# Patient Record
Sex: Female | Born: 1980 | Race: Black or African American | Hispanic: No | Marital: Married | Smoking: Never smoker
Health system: Southern US, Community
[De-identification: ages and names within clinical notes are randomized; demographics above are authoritative.]

## PROBLEM LIST (undated history)

## (undated) DIAGNOSIS — C801 Malignant (primary) neoplasm, unspecified: Secondary | ICD-10-CM

## (undated) DIAGNOSIS — D649 Anemia, unspecified: Secondary | ICD-10-CM

## (undated) DIAGNOSIS — I10 Essential (primary) hypertension: Secondary | ICD-10-CM

## (undated) DIAGNOSIS — F419 Anxiety disorder, unspecified: Secondary | ICD-10-CM

## (undated) HISTORY — PX: FACIAL COSMETIC SURGERY: SHX629

## (undated) HISTORY — DX: Essential (primary) hypertension: I10

---

## 2011-07-28 DIAGNOSIS — D649 Anemia, unspecified: Secondary | ICD-10-CM | POA: Insufficient documentation

## 2015-07-20 ENCOUNTER — Emergency Department
Admission: EM | Admit: 2015-07-20 | Discharge: 2015-07-20 | Disposition: A | Discharge status: Home / Self Care | Payer: Self-pay | Attending: Student | Admitting: Student

## 2015-07-20 ENCOUNTER — Encounter: Payer: Self-pay | Admitting: Emergency Medicine

## 2015-07-20 DIAGNOSIS — Y9289 Other specified places as the place of occurrence of the external cause: Secondary | ICD-10-CM | POA: Insufficient documentation

## 2015-07-20 DIAGNOSIS — Y998 Other external cause status: Secondary | ICD-10-CM | POA: Insufficient documentation

## 2015-07-20 DIAGNOSIS — E669 Obesity, unspecified: Secondary | ICD-10-CM | POA: Insufficient documentation

## 2015-07-20 DIAGNOSIS — R030 Elevated blood-pressure reading, without diagnosis of hypertension: Secondary | ICD-10-CM | POA: Insufficient documentation

## 2015-07-20 DIAGNOSIS — Y9389 Activity, other specified: Secondary | ICD-10-CM | POA: Insufficient documentation

## 2015-07-20 DIAGNOSIS — W57XXXA Bitten or stung by nonvenomous insect and other nonvenomous arthropods, initial encounter: Secondary | ICD-10-CM | POA: Insufficient documentation

## 2015-07-20 DIAGNOSIS — L509 Urticaria, unspecified: Secondary | ICD-10-CM | POA: Insufficient documentation

## 2015-07-20 HISTORY — DX: Anemia, unspecified: D64.9

## 2015-07-20 MED ORDER — HYDROXYZINE HCL 50 MG PO TABS
50.0000 mg | ORAL_TABLET | Freq: Once | ORAL | Status: AC
  Administered 2015-07-20: 50 mg via ORAL
  Filled 2015-07-20: qty 1

## 2015-07-20 MED ORDER — METHYLPREDNISOLONE 4 MG PO TBPK: ORAL_TABLET | ORAL | Status: DC

## 2015-07-20 MED ORDER — HYDROXYZINE HCL 50 MG PO TABS: 50.0000 mg | ORAL_TABLET | Freq: Three times a day (TID) | ORAL | Status: DC | PRN

## 2015-07-20 MED ORDER — PREDNISONE 20 MG PO TABS
60.0000 mg | ORAL_TABLET | Freq: Once | ORAL | Status: AC
  Administered 2015-07-20: 60 mg via ORAL
  Filled 2015-07-20: qty 3

## 2015-07-20 NOTE — ED Provider Notes (Signed)
[facility] Emergency Department Provider Note  ____________________________________________  Time seen: Approximately 6:21 PM  I have reviewed the triage vital signs and the nursing notes.   HISTORY  Chief Complaint Insect Bite    HPI Amanda Pearson is a 34 y.o. female patient reported hives secondary to insect bites on bilateral hands and legs and facial area. Patient state is very intense itching. Patient also states she's been in contact file a dog that was when was eyes, from the exposure to the animal. Patient denies any dysuria fever or chills. Patient denies any nausea vomiting diarrhea. She rates her discomfort as a 5/10. Patient is taking over-the-counter Benadryl with no relief.   Past Medical History  Diagnosis Date  . Anemia     There are no active problems to display for this patient.   Past Surgical History  Procedure Laterality Date  . Facial cosmetic surgery      Current Outpatient Rx  Name  Route  Sig  Dispense  Refill  . hydrOXYzine (ATARAX/VISTARIL) 50 MG tablet   Oral   Take 1 tablet (50 mg total) by mouth 3 (three) times daily as needed.   30 tablet   0   . methylPREDNISolone (MEDROL DOSEPAK) 4 MG TBPK tablet      Take Tapered dose as directed   21 tablet   0     Allergies Review of patient's allergies indicates no known allergies.  No family history on file.  Social History History  Substance Use Topics  . Smoking status: Never Smoker   . Smokeless tobacco: Not on file  . Alcohol Use: No    Review of Systems Constitutional: No fever/chills Eyes: No visual changes. ENT: No sore throat. Cardiovascular: Denies chest pain. Respiratory: Denies shortness of breath. Gastrointestinal: No abdominal pain.  No nausea, no vomiting.  No diarrhea.  No constipation. Genitourinary: Negative for dysuria. Musculoskeletal: Negative for back pain. Skin: Hives Neurological: Negative for headaches, focal weakness or  numbness. Endocrine: Hematological/Lymphatic: Allergic/Immunilogical:10-point ROS otherwise negative.  ____________________________________________   PHYSICAL EXAM:  VITAL SIGNS: ED Triage Vitals  Enc Vitals Group     BP 07/20/15 1738 164/90 mmHg     Pulse Rate 07/20/15 1738 97     Resp 07/20/15 1738 18     Temp 07/20/15 1738 98.1 F (36.7 C)     Temp Source 07/20/15 1738 Oral     SpO2 07/20/15 1738 100 %     Weight 07/20/15 1738 231 lb (104.781 kg)     Height 07/20/15 1738 5\' 3"  (1.6 m)     Head Cir --      Peak Flow --      Pain Score 07/20/15 1738 5     Pain Loc --      Pain Edu? --      Excl. in [place]? --     Constitutional: Alert and oriented. Well appearing and in no acute distress. Obesity Eyes: Conjunctivae are normal. PERRL. EOMI. Head: Atraumatic. Nose: No congestion/rhinnorhea. Mouth/Throat: Mucous membranes are moist.  Oropharynx non-erythematous. Neck: No stridor. No cervical spine tenderness to palpation. Hematological/Lymphatic/Immunilogical: No cervical lymphadenopathy. Cardiovascular: Normal rate, regular rhythm. Grossly normal heart sounds.  Good peripheral circulation. Mild elevation of blood pressure. Respiratory: Normal respiratory effort.  No retractions. Lungs CTAB. Gastrointestinal: Soft and nontender. No distention. No abdominal bruits. No CVA tenderness. Musculoskeletal: No lower extremity tenderness nor edema.  No joint effusions. Neurologic:  Normal speech and language. No gross focal neurologic deficits are appreciated.  No gait instability. Skin:  Skin is warm, dry and intact. Macular lesion upper and lower extremities . No signs of secondary infection.  Psychiatric: Mood and affect are normal. Speech and behavior are normal.  ____________________________________________   LABS (all labs ordered are listed, but only abnormal results are displayed)  Labs Reviewed - No data to  display ____________________________________________  EKG   ____________________________________________  RADIOLOGY   ____________________________________________   PROCEDURES  Procedure(s) performed: None  Critical Care performed: No  ____________________________________________   INITIAL IMPRESSION / ASSESSMENT AND PLAN / ED COURSE  Pertinent labs & imaging results that were available during my care of the patient were reviewed by me and considered in my medical decision making (see chart for details).  Hives etiology unknown. Patient given Atarax and prednisone in the ER. Patient described a Medrol Dosepak and Atarax on discharge. Patient advised follow-up family doctor if no improvement in 2-3 days. He is advised determine if condition worsens. ____________________________________________   FINAL CLINICAL IMPRESSION(S) / ED DIAGNOSES  Final diagnoses:  Hives of unknown origin      Sable Feil, PA-C 07/20/15 [place], MD 07/20/15 [phone]

## 2015-07-20 NOTE — ED Notes (Signed)
Pt reports hives/insect bites to bilateral arms, legs, face since Monday. Pt reports itching, was around a dog, unsure if from that.

## 2017-10-16 ENCOUNTER — Emergency Department: Payer: BC Managed Care – PPO

## 2017-10-16 ENCOUNTER — Encounter: Payer: Self-pay | Admitting: Emergency Medicine

## 2017-10-16 ENCOUNTER — Emergency Department
Admission: EM | Admit: 2017-10-16 | Discharge: 2017-10-16 | Disposition: A | Discharge status: Home / Self Care | Payer: BC Managed Care – PPO | Attending: Emergency Medicine | Admitting: Emergency Medicine

## 2017-10-16 DIAGNOSIS — D509 Iron deficiency anemia, unspecified: Secondary | ICD-10-CM | POA: Insufficient documentation

## 2017-10-16 DIAGNOSIS — Z79899 Other long term (current) drug therapy: Secondary | ICD-10-CM | POA: Diagnosis not present

## 2017-10-16 DIAGNOSIS — M79604 Pain in right leg: Secondary | ICD-10-CM

## 2017-10-16 LAB — COMPREHENSIVE METABOLIC PANEL
ALT: 14 U/L (ref 14–54)
AST: 17 U/L (ref 15–41)
Albumin: 3.8 g/dL (ref 3.5–5.0)
Alkaline Phosphatase: 46 U/L (ref 38–126)
Anion gap: 8 (ref 5–15)
BUN: 15 mg/dL (ref 6–20)
CO2: 22 mmol/L (ref 22–32)
Calcium: 9 mg/dL (ref 8.9–10.3)
Chloride: 104 mmol/L (ref 101–111)
Creatinine, Ser: 0.74 mg/dL (ref 0.44–1.00)
GFR calc Af Amer: >60 mL/min (ref >60)
GFR calc non Af Amer: >60 mL/min (ref >60)
Glucose, Bld: 85 mg/dL (ref 65–99)
Potassium: 3.7 mmol/L (ref 3.5–5.1)
Sodium: 134 mmol/L — ABNORMAL LOW (ref 135–145)
Total Bilirubin: 0.7 mg/dL (ref 0.3–1.2)
Total Protein: 8.1 g/dL (ref 6.5–8.1)

## 2017-10-16 LAB — CBC WITH DIFFERENTIAL/PLATELET
Basophils Absolute: 0 10*3/uL (ref 0–0.1)
Basophils Relative: 0 %
Eosinophils Absolute: 0 10*3/uL (ref 0–0.7)
Eosinophils Relative: 0 %
HCT: 28.8 % — ABNORMAL LOW (ref 35.0–47.0)
Hemoglobin: 8.6 g/dL — ABNORMAL LOW (ref 12.0–16.0)
Lymphocytes Relative: 16 %
Lymphs Abs: 1.6 10*3/uL (ref 1.0–3.6)
MCH: 20.4 pg — ABNORMAL LOW (ref 26.0–34.0)
MCHC: 30 g/dL — ABNORMAL LOW (ref 32.0–36.0)
MCV: 67.9 fL — ABNORMAL LOW (ref 80.0–100.0)
Monocytes Absolute: 0.6 10*3/uL (ref 0.2–0.9)
Monocytes Relative: 6 %
Neutro Abs: 7.9 10*3/uL — ABNORMAL HIGH (ref 1.4–6.5)
Neutrophils Relative %: 78 %
Platelets: 429 10*3/uL (ref 150–440)
RBC: 4.24 MIL/uL (ref 3.80–5.20)
RDW: 18.7 % — ABNORMAL HIGH (ref 11.5–14.5)
WBC: 10.1 10*3/uL (ref 3.6–11.0)

## 2017-10-16 MED ORDER — ORPHENADRINE CITRATE 30 MG/ML IJ SOLN
60.0000 mg | Freq: Two times a day (BID) | INTRAMUSCULAR | Status: DC
  Administered 2017-10-16: 60 mg via INTRAMUSCULAR
  Filled 2017-10-16: qty 2

## 2017-10-16 MED ORDER — ORPHENADRINE CITRATE ER 100 MG PO TB12: 100.0000 mg | ORAL_TABLET | Freq: Two times a day (BID) | ORAL | 0 refills | Status: AC | PRN

## 2017-10-16 NOTE — ED Triage Notes (Signed)
Pt states that she has been having pain in her right leg x several months. Pt states that the pain feels like little sharp pains. Pt denies swelling in the leg. No recent travel.

## 2017-10-16 NOTE — ED Provider Notes (Signed)
[facility] Emergency Department Provider Note  ____________________________________________  Time seen: Approximately 3:51 PM  I have reviewed the triage vital signs and the nursing notes.   HISTORY  Chief Complaint Leg Pain    HPI Amanda Pearson is a 36 y.o. female  presenting to the emergency department with 8 out of 10, intermittent right posterior leg pain characterized as sharp and constricting.  Patient reports that she has experienced pain since September of this year.  Episodes of right posterior leg pain are increasing in frequency.  Patient reports that she was recently in the car for several hours during a family vacation but her right posterior leg pain had occurred prior to travel.  She denies daily smoking, other forms of recent travel, knowledge of current malignancy or history of DVT.  Patient reports that she does not experience pain in the morning but episodes of right posterior knee pain increase in frequency in the afternoon and evening.  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  She denies a history of back pain and has never had sciatica.  She denies knee pain.  She denies falls, mechanisms of trauma or new physical activity habits.  No alleviating measures have been attempted.   Past Medical History:  Diagnosis Date  . Anemia   . Anemia     There are no active problems to display for this patient.   Past Surgical History:  Procedure Laterality Date  . FACIAL COSMETIC SURGERY      Prior to Admission medications   Medication Sig Start Date End Date Taking? Authorizing Provider  hydrOXYzine (ATARAX/VISTARIL) 50 MG tablet Take 1 tablet (50 mg total) by mouth 3 (three) times daily as needed. 07/20/15   Sable Feil, PA-C  methylPREDNISolone (MEDROL DOSEPAK) 4 MG TBPK tablet Take Tapered dose as directed 07/20/15   Sable Feil, PA-C  orphenadrine (NORFLEX) 100 MG tablet Take 1 tablet (100  mg total) 2 (two) times daily as needed for up to 7 days by mouth for muscle spasms. 10/16/17 10/23/17  Lannie Fields, PA-C    Allergies Patient has no known allergies.  No family history on file.  Social History Social History   Tobacco Use  . Smoking status: Never Smoker  . Smokeless tobacco: Never Used  Substance Use Topics  . Alcohol use: No  . Drug use: No     Review of Systems  Constitutional: No fever/chills Eyes: No visual changes. No discharge ENT: No upper respiratory complaints. Cardiovascular: no chest pain. Respiratory: no cough. No SOB. Gastrointestinal: No abdominal pain.  No nausea, no vomiting.  No diarrhea.  No constipation. Musculoskeletal: Patient has right posterior leg pain.  Skin: Negative for rash, abrasions, lacerations, ecchymosis. Neurological: Negative for headaches, focal weakness or numbness.   ____________________________________________   PHYSICAL EXAM:  VITAL SIGNS: ED Triage Vitals  Enc Vitals Group     BP 10/16/17 1446 (!) 152/96     Pulse Rate 10/16/17 1446 (!) 106     Resp 10/16/17 1446 18     Temp 10/16/17 1446 99 F (37.2 C)     Temp Source 10/16/17 1446 Oral     SpO2 10/16/17 1446 100 %     Weight 10/16/17 1446 235 lb (106.6 kg)     Height 10/16/17 1446 5\' 3"  (1.6 m)     Head Circumference --      Peak Flow --      Pain  Score 10/16/17 1448 3     Pain Loc --      Pain Edu? --      Excl. in [place]? --      Constitutional: Alert and oriented. Well appearing and in no acute distress. Eyes: Conjunctivae are normal. PERRL. EOMI. Head: Atraumatic. Cardiovascular: Normal rate, regular rhythm. Normal S1 and S2.  Good peripheral circulation. Respiratory: Normal respiratory effort without tachypnea or retractions. Lungs CTAB. Good air entry to the bases with no decreased or absent breath sounds. Gastrointestinal: Bowel sounds 4 quadrants. Soft and nontender to palpation. No guarding or rigidity. No palpable masses. No  distention. No CVA tenderness. Musculoskeletal: Patient has full range of motion at the right hip, right knee and right ankle.  Patient has no popliteal fullness or pain elicited with palpation at the insertion of the hamstrings.  No calf tenderness was elicited with palpation.  Palpable dorsalis pedis pulse, right. Neurologic:  Normal speech and language. No gross focal neurologic deficits are appreciated.  Skin: No edema or surrounding cellulitis was visualized of the right lower extremity. Psychiatric: Mood and affect are normal. Speech and behavior are normal. Patient exhibits appropriate insight and judgement.   ____________________________________________   LABS (all labs ordered are listed, but only abnormal results are displayed)  Labs Reviewed  CBC WITH DIFFERENTIAL/PLATELET - Abnormal; Notable for the following components:      Result Value   Hemoglobin 8.6 (*)    HCT 28.8 (*)    MCV 67.9 (*)    MCH 20.4 (*)    MCHC 30.0 (*)    RDW 18.7 (*)    Neutro Abs 7.9 (*)    All other components within normal limits  COMPREHENSIVE METABOLIC PANEL - Abnormal; Notable for the following components:   Sodium 134 (*)    All other components within normal limits   ____________________________________________  EKG   ____________________________________________  RADIOLOGY Unk Pinto, personally viewed and evaluated these images  as part of my medical decision making, as well as reviewing the written report by the radiologist.  US Venous Img Lower Unilateral Right  Result Date: 10/16/2017 CLINICAL DATA:  Right lower extremity pain for 1 month. EXAM: Right LOWER EXTREMITY VENOUS DOPPLER ULTRASOUND TECHNIQUE: Gray-scale sonography with graded compression, as well as color Doppler and duplex ultrasound were performed to evaluate the lower extremity deep venous systems from the level of the common femoral vein and including the common femoral, femoral, profunda femoral, popliteal and  calf veins including the posterior tibial, peroneal and gastrocnemius veins when visible. The superficial great saphenous vein was also interrogated. Spectral Doppler was utilized to evaluate flow at rest and with distal augmentation maneuvers in the common femoral, femoral and popliteal veins. COMPARISON:  None. FINDINGS: Contralateral Common Femoral Vein: Respiratory phasicity is normal and symmetric with the symptomatic side. No evidence of thrombus. Normal compressibility. Common Femoral Vein: No evidence of thrombus. Normal compressibility, respiratory phasicity and response to augmentation. Saphenofemoral Junction: No evidence of thrombus. Normal compressibility and flow on color Doppler imaging. Profunda Femoral Vein: No evidence of thrombus. Normal compressibility and flow on color Doppler imaging. Femoral Vein: No evidence of thrombus. Normal compressibility, respiratory phasicity and response to augmentation. Popliteal Vein: No evidence of thrombus. Normal compressibility, respiratory phasicity and response to augmentation. Calf Veins: No evidence of thrombus. Normal compressibility and flow on color Doppler imaging. Superficial Great Saphenous Vein: No evidence of thrombus. Normal compressibility. Venous Reflux:  None. Other Findings:  None. IMPRESSION: No evidence of deep  venous thrombosis. Electronically Signed   By: San Morelle M.D.   On: 10/16/2017 17:20    ____________________________________________    PROCEDURES  Procedure(s) performed:    Procedures    Medications  orphenadrine (NORFLEX) injection 60 mg (60 mg Intramuscular Given 10/16/17 1612)     ____________________________________________   INITIAL IMPRESSION / ASSESSMENT AND PLAN / ED COURSE  Pertinent labs & imaging results that were available during my care of the patient were reviewed by me and considered in my medical decision making (see chart for details).  Review of the [place] CSRS was performed in  accordance of the [place] prior to dispensing any controlled drugs.     Assessment and plan Right leg pain Differential diagnosis includes muscle spasm versus electrolyte abnormality versus DVT.  Patient's posterior leg pain improved with Norflex in the emergency department.  Ultrasound examination revealed no evidence of thromboembolism.  Patient has a history of anemia and has not been adhering to iron supplementation.  I suspect that patient's posterior leg muscle spasms might be secondary to severity of anemia.  Patient was advised to increase her red meat consumption and adhere to supplemental iron.  She was discharged with Norflex and advised to follow-up with primary care.  ____________________________________________  FINAL CLINICAL IMPRESSION(S) / ED DIAGNOSES  Final diagnoses:  Iron deficiency anemia, unspecified iron deficiency anemia type      NEW MEDICATIONS STARTED DURING THIS VISIT:  This SmartLink is deprecated. Use AVSMEDLIST instead to display the medication list for a patient.      This chart was dictated using voice recognition software/Dragon. Despite best efforts to proofread, errors can occur which can change the meaning. Any change was purely unintentional.    Lannie Fields, PA-C 10/16/17 1736    Schuyler Amor, MD 10/16/17 [phone]

## 2019-11-08 ENCOUNTER — Encounter: Payer: Self-pay | Admitting: Emergency Medicine

## 2019-11-08 ENCOUNTER — Emergency Department
Admission: EM | Admit: 2019-11-08 | Discharge: 2019-11-08 | Disposition: A | Discharge status: Home / Self Care | Payer: BC Managed Care – PPO | Attending: Emergency Medicine | Admitting: Emergency Medicine

## 2019-11-08 ENCOUNTER — Other Ambulatory Visit: Payer: Self-pay

## 2019-11-08 DIAGNOSIS — S61011A Laceration without foreign body of right thumb without damage to nail, initial encounter: Secondary | ICD-10-CM | POA: Insufficient documentation

## 2019-11-08 DIAGNOSIS — Y929 Unspecified place or not applicable: Secondary | ICD-10-CM | POA: Diagnosis not present

## 2019-11-08 DIAGNOSIS — Z23 Encounter for immunization: Secondary | ICD-10-CM | POA: Diagnosis not present

## 2019-11-08 DIAGNOSIS — W274XXA Contact with kitchen utensil, initial encounter: Secondary | ICD-10-CM | POA: Insufficient documentation

## 2019-11-08 DIAGNOSIS — Y93G1 Activity, food preparation and clean up: Secondary | ICD-10-CM | POA: Insufficient documentation

## 2019-11-08 DIAGNOSIS — Y999 Unspecified external cause status: Secondary | ICD-10-CM | POA: Diagnosis not present

## 2019-11-08 MED ORDER — LIDOCAINE HCL (PF) 1 % IJ SOLN
5.0000 mL | Freq: Once | INTRAMUSCULAR | Status: DC
  Filled 2019-11-08: qty 5

## 2019-11-08 MED ORDER — LIDOCAINE-EPINEPHRINE-TETRACAINE (LET) TOPICAL GEL
3.0000 mL | Freq: Once | TOPICAL | Status: AC
  Administered 2019-11-08: 3 mL via TOPICAL

## 2019-11-08 MED ORDER — TETANUS-DIPHTH-ACELL PERTUSSIS 5-2.5-18.5 LF-MCG/0.5 IM SUSP
0.5000 mL | Freq: Once | INTRAMUSCULAR | Status: AC
  Administered 2019-11-08: 0.5 mL via INTRAMUSCULAR
  Filled 2019-11-08: qty 0.5

## 2019-11-08 NOTE — ED Triage Notes (Signed)
Pt presents to ED via POV with c/o laceration to R thumb, states was cutting sweet potatoes when she cut her thumb with a knife, unknown when last tetanus shot was

## 2019-11-08 NOTE — ED Notes (Signed)
Jenise, PA applying dermabond now.

## 2019-11-08 NOTE — ED Notes (Signed)
Pt with cut to R thumb; states it is about 1inch long; states bleed heavily for first 30 minutes at home; currently wrapped in bandage with bleeding under control; 5/10 throbbing pain; radial pulse 2+ at R wrist; cap refill <3 seconds at R thumb.

## 2019-11-08 NOTE — Discharge Instructions (Addendum)
Keep the wound clean, dry,and covered. Avoid lotion, oils, creams, or ointments on the wound glue.

## 2019-11-08 NOTE — ED Provider Notes (Signed)
[facility] Emergency Department Provider Note ____________________________________________  Time seen: 1530  I have reviewed the triage vital signs and the nursing notes.  HISTORY  Chief Complaint  Laceration  HPI Amanda Pearson is a 38 y.o. female presents himself to the ED for evaluation of an accidental laceration to the right thumb.  Patient was at home cutting sweet potatoes with a mandolin, when she accidentally cut the lateral tip of her right thumb.  She presents now for evaluation of injury.  She describes an unknown tetanus status at this time.  Past Medical History:  Diagnosis Date  . Anemia   . Anemia     There are no active problems to display for this patient.   Past Surgical History:  Procedure Laterality Date  . FACIAL COSMETIC SURGERY      Prior to Admission medications   Medication Sig Start Date End Date Taking? Authorizing Provider  hydrOXYzine (ATARAX/VISTARIL) 50 MG tablet Take 1 tablet (50 mg total) by mouth 3 (three) times daily as needed. 07/20/15   Sable Feil, PA-C  methylPREDNISolone (MEDROL DOSEPAK) 4 MG TBPK tablet Take Tapered dose as directed 07/20/15   Sable Feil, PA-C    Allergies Patient has no known allergies.  No family history on file.  Social History Social History   Tobacco Use  . Smoking status: Never Smoker  . Smokeless tobacco: Never Used  Substance Use Topics  . Alcohol use: No  . Drug use: No    Review of Systems  Constitutional: Negative for fever. Cardiovascular: Negative for chest pain. Respiratory: Negative for shortness of breath. Musculoskeletal: Negative for back pain. Skin: Negative for rash.  Right thumb laceration as above. Neurological: Negative for headaches, focal weakness or numbness. ____________________________________________  PHYSICAL EXAM:  VITAL SIGNS: ED Triage Vitals  Enc Vitals Group     BP 11/08/19 1510 (!) 173/104     Pulse Rate 11/08/19 1510 99     Resp  11/08/19 1510 18     Temp 11/08/19 1510 99.9 F (37.7 C)     Temp Source 11/08/19 1510 Oral     SpO2 11/08/19 1510 99 %     Weight 11/08/19 1508 255 lb (115.7 kg)     Height 11/08/19 1508 5\' 3"  (1.6 m)     Head Circumference --      Peak Flow --      Pain Score 11/08/19 1508 5     Pain Loc --      Pain Edu? --      Excl. in [place]? --     Constitutional: Alert and oriented. Well appearing and in no distress. Head: Normocephalic and atraumatic. Eyes: Conjunctivae are normal. Normal extraocular movements Cardiovascular: Normal rate, regular rhythm. Normal distal pulses. Respiratory: Normal respiratory effort.  Musculoskeletal: Normal composite fist on the right.  Right thumb with a long superficial skin flap laceration through the fat pad.  There is slow capillary bleeding noted.  No deep wound defect is noted.  Nontender with normal range of motion in all extremities.  Neurologic:  Normal sensation.  Normal speech and language. No gross focal neurologic deficits are appreciated. Skin:  Skin is warm, dry and intact. No rash noted. ____________________________________________  PROCEDURES  Tdap 0.5 ml IM .Marland KitchenLaceration Repair  Date/Time: 11/08/2019 3:44 PM Performed by: Melvenia Needles, PA-C Authorized by: Melvenia Needles, PA-C   Consent:    Consent obtained:  Verbal   Consent given by:  Patient  Risks discussed:  Pain and poor wound healing Anesthesia (see MAR for exact dosages):    Anesthesia method:  Topical application   Topical anesthetic:  LET Laceration details:    Location:  Finger   Finger location:  R thumb   Length (cm):  3   Depth (mm):  3 Repair type:    Repair type:  Simple Pre-procedure details:    Preparation:  Patient was prepped and draped in usual sterile fashion Exploration:    Hemostasis achieved with:  LET   Contaminated: no   Treatment:    Area cleansed with:  Saline   Amount of cleaning:  Standard   Irrigation solution:  Sterile  saline Skin repair:    Repair method:  Tissue adhesive Approximation:    Approximation:  Close Post-procedure details:    Dressing:  Open (no dressing)   Patient tolerance of procedure:  Tolerated well, no immediate complications  ____________________________________________  INITIAL IMPRESSION / ASSESSMENT AND PLAN / ED COURSE  Patient with ED evaluation management of an accidental laceration to the lateral right thumb.  Patient presents with a large flap laceration after using a mandolin.  The skin defect is repaired using wound adhesive.  Patient is discharged with wound care instructions.  She will follow with primary provider or return to the ED as needed.  [name] was evaluated in Emergency Department on 11/08/2019 for the symptoms described in the history of present illness. She was evaluated in the context of the global COVID-19 pandemic, which necessitated consideration that the patient might be at risk for infection with the SARS-CoV-2 virus that causes COVID-19. Institutional protocols and algorithms that pertain to the evaluation of patients at risk for COVID-19 are in a state of rapid change based on information released by regulatory bodies including the CDC and federal and state organizations. These policies and algorithms were followed during the patient's care in the ED. ____________________________________________  FINAL CLINICAL IMPRESSION(S) / ED DIAGNOSES  Final diagnoses:  Laceration of right thumb without foreign body without damage to nail, initial encounter      Melvenia Needles, PA-C 11/08/19 1719    Blake Divine, MD 11/08/19 2030

## 2020-12-29 ENCOUNTER — Ambulatory Visit: Payer: BC Managed Care – PPO | Admitting: Obstetrics and Gynecology

## 2021-01-15 ENCOUNTER — Ambulatory Visit: Payer: Self-pay | Admitting: Obstetrics and Gynecology

## 2021-02-03 ENCOUNTER — Encounter: Payer: Self-pay | Admitting: Obstetrics and Gynecology

## 2021-02-03 ENCOUNTER — Other Ambulatory Visit: Payer: Self-pay

## 2021-02-03 ENCOUNTER — Other Ambulatory Visit (HOSPITAL_COMMUNITY)
Admission: RE | Admit: 2021-02-03 | Discharge: 2021-02-03 | Disposition: A | Discharge status: Home / Self Care | Payer: Self-pay | Source: Ambulatory Visit | Attending: Obstetrics and Gynecology | Admitting: Obstetrics and Gynecology

## 2021-02-03 ENCOUNTER — Ambulatory Visit (INDEPENDENT_AMBULATORY_CARE_PROVIDER_SITE_OTHER): Payer: BC Managed Care – PPO | Admitting: Obstetrics and Gynecology

## 2021-02-03 VITALS — BP 138/74 | Ht 63.0 in | Wt 243.0 lb

## 2021-02-03 DIAGNOSIS — Z0000 Encounter for general adult medical examination without abnormal findings: Secondary | ICD-10-CM

## 2021-02-03 DIAGNOSIS — Z01419 Encounter for gynecological examination (general) (routine) without abnormal findings: Secondary | ICD-10-CM

## 2021-02-03 DIAGNOSIS — Z1231 Encounter for screening mammogram for malignant neoplasm of breast: Secondary | ICD-10-CM

## 2021-02-03 DIAGNOSIS — N6321 Unspecified lump in the left breast, upper outer quadrant: Secondary | ICD-10-CM

## 2021-02-03 DIAGNOSIS — Z130 Encounter for screening for diseases of the blood and blood-forming organs and certain disorders involving the immune mechanism: Secondary | ICD-10-CM

## 2021-02-03 DIAGNOSIS — Z1329 Encounter for screening for other suspected endocrine disorder: Secondary | ICD-10-CM

## 2021-02-03 DIAGNOSIS — Z13220 Encounter for screening for lipoid disorders: Secondary | ICD-10-CM

## 2021-02-03 DIAGNOSIS — N920 Excessive and frequent menstruation with regular cycle: Secondary | ICD-10-CM

## 2021-02-03 DIAGNOSIS — Z124 Encounter for screening for malignant neoplasm of cervix: Secondary | ICD-10-CM | POA: Insufficient documentation

## 2021-02-03 DIAGNOSIS — Z131 Encounter for screening for diabetes mellitus: Secondary | ICD-10-CM

## 2021-02-03 NOTE — Patient Instructions (Signed)
Institute of Medicine Recommended Dietary Allowances for Calcium and Vitamin D  Age (yr) Calcium Recommended Dietary Allowance (mg/day) Vitamin D Recommended Dietary Allowance (international units/day)  9-18 1,300 600  19-50 1,000 600  51-70 1,200 600  71 and older 1,200 800  Data from Institute of Medicine. Dietary reference intakes: calcium, vitamin D. [place], [place]: National Academies Press; 2011.    Exercising to Stay Healthy To become healthy and stay healthy, it is recommended that you do moderate-intensity and vigorous-intensity exercise. You can tell that you are exercising at a moderate intensity if your heart starts beating faster and you start breathing faster but can still hold a conversation. You can tell that you are exercising at a vigorous intensity if you are breathing much harder and faster and cannot hold a conversation while exercising. Exercising regularly is important. It has many health benefits, such as:  Improving overall fitness, flexibility, and endurance.  Increasing bone density.  Helping with weight control.  Decreasing body fat.  Increasing muscle strength.  Reducing stress and tension.  Improving overall health. How often should I exercise? Choose an activity that you enjoy, and set realistic goals. Your health care provider can help you make an activity plan that works for you. Exercise regularly as told by your health care provider. This may include:  Doing strength training two times a week, such as: ? Lifting weights. ? Using resistance bands. ? Push-ups. ? Sit-ups. ? Yoga.  Doing a certain intensity of exercise for a given amount of time. Choose from these options: ? A total of 150 minutes of moderate-intensity exercise every week. ? A total of 75 minutes of vigorous-intensity exercise every week. ? A mix of moderate-intensity and vigorous-intensity exercise every week. Children, pregnant women, people who have not exercised  regularly, people who are overweight, and older adults may need to talk with a health care provider about what activities are safe to do. If you have a medical condition, be sure to talk with your health care provider before you start a new exercise program. What are some exercise ideas? Moderate-intensity exercise ideas include:  Walking 1 mile (1.6 km) in about 15 minutes.  Biking.  Hiking.  Golfing.  Dancing.  Water aerobics. Vigorous-intensity exercise ideas include:  Walking 4.5 miles (7.2 km) or more in about 1 hour.  Jogging or running 5 miles (8 km) in about 1 hour.  Biking 10 miles (16.1 km) or more in about 1 hour.  Lap swimming.  Roller-skating or in-line skating.  Cross-country skiing.  Vigorous competitive sports, such as football, basketball, and soccer.  Jumping rope.  Aerobic dancing.   What are some everyday activities that can help me to get exercise?  Yard work, such as: ? Pushing a lawn mower. ? Raking and bagging leaves.  Washing your car.  Pushing a stroller.  Shoveling snow.  Gardening.  Washing windows or floors. How can I be more active in my day-to-day activities?  Use stairs instead of an elevator.  Take a walk during your lunch break.  If you drive, park your car farther away from your work or school.  If you take public transportation, get off one stop early and walk the rest of the way.  Stand up or walk around during all of your indoor phone calls.  Get up, stretch, and walk around every 30 minutes throughout the day.  Enjoy exercise with a friend. Support to continue exercising will help you keep a regular routine of activity. What guidelines   can I follow while exercising?  Before you start a new exercise program, talk with your health care provider.  Do not exercise so much that you hurt yourself, feel dizzy, or get very short of breath.  Wear comfortable clothes and wear shoes with good support.  Drink plenty of  water while you exercise to prevent dehydration or heat stroke.  Work out until your breathing and your heartbeat get faster. Where to find more information  [place].S[place] Department of Health and Human Services: www.hhs.gov  Centers for Disease Control and Prevention (CDC): www.cdc.gov Summary  Exercising regularly is important. It will improve your overall fitness, flexibility, and endurance.  Regular exercise also will improve your overall health. It can help you control your weight, reduce stress, and improve your bone density.  Do not exercise so much that you hurt yourself, feel dizzy, or get very short of breath.  Before you start a new exercise program, talk with your health care provider. This information is not intended to replace advice given to you by your health care provider. Make sure you discuss any questions you have with your health care provider. Document Revised: 11/11/2017 Document Reviewed: 10/20/2017 Elsevier Patient Education  2021 Elsevier Inc.   Budget-Friendly Healthy Eating There are many ways to save money at the grocery store and continue to eat healthy. You can be successful if you:  Plan meals according to your budget.  Make a grocery list and only purchase food according to your grocery list.  Prepare food yourself at home. What are tips for following this plan? Reading food labels  Compare food labels between brand name foods and the store brand. Often the nutritional value is the same, but the store brand is lower cost.  Look for products that do not have added sugar, fat, or salt (sodium). These often cost the same but are healthier for you. Products may be labeled as: ? Sugar-free. ? Nonfat. ? Low-fat. ? Sodium-free. ? Low-sodium.  Look for lean ground beef labeled as at least 92% lean and 8% fat. Shopping  Buy only the items on your grocery list and go only to the areas of the store that have the items on your list.  Use coupons only for  foods and brands you normally buy. Avoid buying items you wouldn't normally buy simply because they are on sale.  Check online and in newspapers for weekly deals.  Buy healthy items from the bulk bins when available, such as herbs, spices, flour, pasta, nuts, and dried fruit.  Buy fruits and vegetables that are in season. Prices are usually lower on in-season produce.  Look at the unit price on the price tag. Use it to compare different brands and sizes to find out which item is the best deal.  Choose healthy items that are often low-cost, such as carrots, potatoes, apples, bananas, and oranges. Dried or canned beans are a low-cost protein source.  Buy in bulk and freeze extra food. Items you can buy in bulk include meats, fish, poultry, frozen fruits, and frozen vegetables.  Avoid buying "ready-to-eat" foods, such as pre-cut fruits and vegetables and pre-made salads.  If possible, shop around to discover where you can find the best prices. Consider other retailers such as dollar stores, larger wholesale stores, local fruit and vegetable stands, and farmers markets.  Do not shop when you are hungry. If you shop while hungry, it may be hard to stick to your list and budget.  Resist impulse buying. Use your grocery   list as your official plan for the week.  Buy a variety of vegetables and fruits by purchasing fresh, frozen, and canned items.  Look at the top and bottom shelves for deals. Foods at eye level (eye level of an adult or child) are usually more expensive.  Be efficient with your time when shopping. The more time you spend at the store, the more money you are likely to spend.  To save money when choosing more expensive foods like meats and dairy: ? Choose cheaper cuts of meat, such as bone-in chicken thighs and drumsticks instead of skinless and boneless chicken. When you are ready to prepare the chicken, you can remove the skin yourself to make it healthier. ? Choose lean meats  like chicken or turkey instead of beef. ? Choose canned seafood, such as tuna, salmon, or sardines. ? Buy eggs as a low-cost source of protein. ? Buy dried beans and peas, such as lentils, split peas, or kidney beans instead of meats. Dried beans and peas are a good alternative source of protein. ? Buy the larger tubs of yogurt instead of individual-sized containers.  Choose water instead of sodas and other sweetened beverages.  Avoid buying chips, cookies, and other "junk food." These items are usually expensive and not healthy.   Cooking  Make extra food and freeze the extras in meal-sized containers or in individual portions for fast meals and snacks.  Pre-cook on days when you have extra time to prepare meals in advance. You can keep these meals in the fridge or freezer and reheat for a quick meal.  When you come home from the grocery store, wash, peel, and cut fruits and vegetables so they are ready to use and eat. This will help reduce food waste. Meal planning  Do not eat out or get fast food. Prepare food at home.  Make a grocery list and make sure to bring it with you to the store. If you have a smart phone, you could use your phone to create your shopping list.  Plan meals and snacks according to a grocery list and budget you create.  Use leftovers in your meal plan for the week.  Look for recipes where you can cook once and make enough food for two meals.  Prepare budget-friendly types of meals like stews, casseroles, and stir-fry dishes.  Try some meatless meals or try "no cook" meals like salads.  Make sure that half your plate is filled with fruits or vegetables. Choose from fresh, frozen, or canned fruits and vegetables. If eating canned, remember to rinse them before eating. This will remove any excess salt added for packaging. Summary  Eating healthy on a budget is possible if you plan your meals according to your budget, purchase according to your budget and  grocery list, and prepare food yourself.  Tips for buying more food on a limited budget include buying generic brands, using coupons only for foods you normally buy, and buying healthy items from the bulk bins when available.  Tips for buying cheaper food to replace expensive food include choosing cheaper, lean cuts of meat, and buying dried beans and peas. This information is not intended to replace advice given to you by your health care provider. Make sure you discuss any questions you have with your health care provider. Document Revised: 09/11/2020 Document Reviewed: 09/11/2020 Elsevier Patient Education  2021 Elsevier Inc.   Bone Health Bones protect organs, store calcium, anchor muscles, and support the whole body. Keeping your bones   strong is important, especially as you get older. You can take actions to help keep your bones strong and healthy. Why is keeping my bones healthy important? Keeping your bones healthy is important because your body constantly replaces bone cells. Cells get old, and new cells take their place. As we age, we lose bone cells because the body may not be able to make enough new cells to replace the old cells. The amount of bone cells and bone tissue you have is referred to as bone mass. The higher your bone mass, the stronger your bones. The aging process leads to an overall loss of bone mass in the body, which can increase the likelihood of:  Joint pain and stiffness.  Broken bones.  A condition in which the bones become weak and brittle (osteoporosis). A large decline in bone mass occurs in older adults. In women, it occurs about the time of menopause.   What actions can I take to keep my bones healthy? Good health habits are important for maintaining healthy bones. This includes eating nutritious foods and exercising regularly. To have healthy bones, you need to get enough of the right minerals and vitamins. Most nutrition experts recommend getting these  nutrients from the foods that you eat. In some cases, taking supplements may also be recommended. Doing certain types of exercise is also important for bone health. What are the nutritional recommendations for healthy bones? Eating a well-balanced diet with plenty of calcium and vitamin D will help to protect your bones. Nutritional recommendations vary from person to person. Ask your health care provider what is healthy for you. Here are some general guidelines. Get enough calcium Calcium is the most important (essential) mineral for bone health. Most people can get enough calcium from their diet, but supplements may be recommended for people who are at risk for osteoporosis. Good sources of calcium include:  Dairy products, such as low-fat or nonfat milk, cheese, and yogurt.  Dark green leafy vegetables, such as bok choy and broccoli.  Calcium-fortified foods, such as orange juice, cereal, bread, soy beverages, and tofu products.  Nuts, such as almonds. Follow these recommended amounts for daily calcium intake:  Children, age 1-3: 700 mg.  Children, age 4-8: 1,000 mg.  Children, age 9-13: 1,300 mg.  Teens, age 14-18: 1,300 mg.  Adults, age 19-50: 1,000 mg.  Adults, age 51-70: ? Men: 1,000 mg. ? Women: 1,200 mg.  Adults, age 71 or older: 1,200 mg.  Pregnant and breastfeeding females: ? Teens: 1,300 mg. ? Adults: 1,000 mg. Get enough vitamin D Vitamin D is the most essential vitamin for bone health. It helps the body absorb calcium. Sunlight stimulates the skin to make vitamin D, so be sure to get enough sunlight. If you live in a cold climate or you do not get outside often, your health care provider may recommend that you take vitamin D supplements. Good sources of vitamin D in your diet include:  Egg yolks.  Saltwater fish.  Milk and cereal fortified with vitamin D. Follow these recommended amounts for daily vitamin D intake:  Children and teens, age 1-18: 600  international units.  Adults, age 50 or younger: 400-800 international units.  Adults, age 51 or older: 800-1,000 international units. Get other important nutrients Other nutrients that are important for bone health include:  Phosphorus. This mineral is found in meat, poultry, dairy foods, nuts, and legumes. The recommended daily intake for adult men and adult women is 700 mg.  Magnesium. This mineral   is found in seeds, nuts, dark green vegetables, and legumes. The recommended daily intake for adult men is 400-420 mg. For adult women, it is 310-320 mg.  Vitamin K. This vitamin is found in green leafy vegetables. The recommended daily intake is 120 mg for adult men and 90 mg for adult women.   What type of physical activity is best for building and maintaining healthy bones? Weight-bearing and strength-building activities are important for building and maintaining healthy bones. Weight-bearing activities cause muscles and bones to work against gravity. Strength-building activities increase the strength of the muscles that support bones. Weight-bearing and muscle-building activities include:  Walking and hiking.  Jogging and running.  Dancing.  Gym exercises.  Lifting weights.  Tennis and racquetball.  Climbing stairs.  Aerobics. Adults should get at least 30 minutes of moderate physical activity on most days. Children should get at least 60 minutes of moderate physical activity on most days. Ask your health care provider what type of exercise is best for you.   How can I find out if my bone mass is low? Bone mass can be measured with an X-ray test called a bone mineral density (BMD) test. This test is recommended for all women who are age 65 or older. It may also be recommended for:  Men who are age 70 or older.  People who are at risk for osteoporosis because of: ? Having bones that break easily. ? Having a long-term disease that weakens bones, such as kidney disease or  rheumatoid arthritis. ? Having menopause earlier than normal. ? Taking medicine that weakens bones, such as steroids, thyroid hormones, or hormone treatment for breast cancer or prostate cancer. ? Smoking. ? Drinking three or more alcoholic drinks a day. If you find that you have a low bone mass, you may be able to prevent osteoporosis or further bone loss by changing your diet and lifestyle. Where can I find more information? For more information, check out the following websites:  National Osteoporosis Foundation: www.nof.org/patients  National Institutes of Health: www.bones.nih.gov  International Osteoporosis Foundation: www.iofbonehealth.org Summary  The aging process leads to an overall loss of bone mass in the body, which can increase the likelihood of broken bones and osteoporosis.  Eating a well-balanced diet with plenty of calcium and vitamin D will help to protect your bones.  Weight-bearing and strength-building activities are also important for building and maintaining strong bones.  Bone mass can be measured with an X-ray test called a bone mineral density (BMD) test. This information is not intended to replace advice given to you by your health care provider. Make sure you discuss any questions you have with your health care provider. Document Revised: 12/26/2017 Document Reviewed: 12/26/2017 Elsevier Patient Education  2021 Elsevier Inc.   

## 2021-02-03 NOTE — Progress Notes (Signed)
Gynecology Annual Exam  PCP: Patient, No Pcp Per  Chief Complaint:  Chief Complaint  Patient presents with  . Gynecologic Exam    History of Present Illness: Patient is a 40 y.o. No obstetric history on file. presents for annual exam. The patient has no complaints today.   LMP: Patient's last menstrual period was 01/28/2021. Average Interval: regular, 23 days Duration of flow: 5 days Heavy Menses: yes Intermenstrual Bleeding: no. Reports passing grape size clots. Sometimes having gushing or flooding. Most recent period had 1 heavier day of bleeding.  Dysmenorrhea: no  The patient does perform self breast exams.  There is notable family history of breast or ovarian cancer in her family.  The patient has regular exercise: zumba and indoor walking 3 times a week. Reports she recently started healthy lifestyle changes. She is eating better and exercising more. She has cut back on soft drinks. She has lost 20 lbs since November. Gained 40 lbs with Covid stress.   The patient denies current symptoms of depression.   Review of Systems: Review of Systems  Constitutional: Negative for chills, fever, malaise/fatigue and weight loss.  HENT: Negative for congestion, hearing loss and sinus pain.   Eyes: Negative for blurred vision and double vision.  Respiratory: Negative for cough, sputum production, shortness of breath and wheezing.   Cardiovascular: Negative for chest pain, palpitations, orthopnea and leg swelling.  Gastrointestinal: Negative for abdominal pain, constipation, diarrhea, nausea and vomiting.  Genitourinary: Negative for dysuria, flank pain, frequency, hematuria and urgency.  Musculoskeletal: Negative for back pain, falls and joint pain.  Skin: Negative for itching and rash.  Neurological: Negative for dizziness and headaches.  Psychiatric/Behavioral: Negative for depression, substance abuse and suicidal ideas. The patient is not nervous/anxious.     Past Medical  History:  Past Medical History:  Diagnosis Date  . Anemia   . Anemia     Past Surgical History:  Past Surgical History:  Procedure Laterality Date  . FACIAL COSMETIC SURGERY      Gynecologic History:  Patient's last menstrual period was 01/28/2021. Menarche: 12  History of fibroids, polyps, or ovarian cysts? : no  History of PCOS? no Hstory of Endometriosis? no History of abnormal pap smears? no Have you had any sexually transmitted infections in the past? Left blank  Last Pap: Results were: unknown more than 5 years ago  She identifies as a female. She is sexually active with men.   She denies dyspareunia. She denies postcoital bleeding.    Obstetric History: No obstetric history on file.  Family History:  History reviewed. No pertinent family history.  Social History:  Social History   Socioeconomic History  . Marital status: Married    Spouse name: Not on file  . Number of children: Not on file  . Years of education: Not on file  . Highest education level: Not on file  Occupational History  . Not on file  Tobacco Use  . Smoking status: Never Smoker  . Smokeless tobacco: Never Used  Vaping Use  . Vaping Use: Never used  Substance and Sexual Activity  . Alcohol use: No  . Drug use: No  . Sexual activity: Yes  Other Topics Concern  . Not on file  Social History Narrative  . Not on file   Social Determinants of Health   Financial Resource Strain: Not on file  Food Insecurity: Not on file  Transportation Needs: Not on file  Physical Activity: Not on file  Stress: Not  on file  Social Connections: Not on file  Intimate Partner Violence: Not on file    Allergies:  No Known Allergies  Medications: Prior to Admission medications   Medication Sig Start Date End Date Taking? Authorizing Provider  hydrOXYzine (ATARAX/VISTARIL) 50 MG tablet Take 1 tablet (50 mg total) by mouth 3 (three) times daily as needed. Patient not taking: Reported on 02/03/2021  07/20/15   Sable Feil, PA-C  methylPREDNISolone (MEDROL DOSEPAK) 4 MG TBPK tablet Take Tapered dose as directed Patient not taking: Reported on 02/03/2021 07/20/15   Sable Feil, PA-C    Physical Exam Vitals: Blood pressure 138/74, height 5\' 3"  (1.6 m), weight 243 lb (110.2 kg), last menstrual period 01/28/2021.  Physical Exam Constitutional:      Appearance: She is well-developed.  Genitourinary:     Genitourinary Comments: External: Normal appearing vulva. No lesions noted.  Speculum examination: Normal appearing cervix. No blood in the vaginal vault. No discharge.   Bimanual examination: Uterus midline, non-tender, normal in size, shape and contour.  No CMT. No adnexal masses. No adnexal tenderness. Pelvis not fixed.   Breasts:     Right: No mass, nipple discharge or skin change.     Left: Mass present. No nipple discharge or skin change.    HENT:     Head: Normocephalic and atraumatic.  Neck:     Thyroid: No thyromegaly.  Cardiovascular:     Rate and Rhythm: Normal rate and regular rhythm.     Heart sounds: Normal heart sounds.  Pulmonary:     Effort: Pulmonary effort is normal.     Breath sounds: Normal breath sounds.  Chest:    Abdominal:     General: Bowel sounds are normal. There is no distension.     Palpations: Abdomen is soft. There is no mass.  Musculoskeletal:     Cervical back: Neck supple.  Neurological:     Mental Status: She is alert and oriented to person, place, and time.  Skin:    General: Skin is warm and dry.  Psychiatric:        Behavior: Behavior normal.        Thought Content: Thought content normal.        Judgment: Judgment normal.  Vitals reviewed. Exam conducted with a chaperone present.      Female chaperone present for pelvic and breast  portions of the physical exam  Assessment: 40 y.o. No obstetric history on file. routine annual exam  Plan: Problem List Items Addressed This Visit   None   Visit Diagnoses    Health  maintenance examination    -  Primary   Breast cancer screening by mammogram       Relevant Orders   MM DIAG BREAST TOMO BILATERAL   US BREAST LTD UNI LEFT INC AXILLA   US BREAST LTD UNI RIGHT INC AXILLA   Screening for diabetes mellitus       Relevant Orders   Basic Metabolic Panel (BMET)   Screening for deficiency anemia       Relevant Orders   CBC With Differential   Screening for thyroid disorder       Relevant Orders   TSH   Screening cholesterol level       Relevant Orders   Lipid panel   Encounter for annual routine gynecological examination       Encounter for gynecological examination without abnormal finding       Menorrhagia with regular cycle  Relevant Orders   CBC With Differential   TSH   Cervical cancer screening       Relevant Orders   Cytology - PAP   Breast lump on left side at 2 o'clock position       Relevant Orders   MM DIAG BREAST TOMO BILATERAL   US BREAST LTD UNI LEFT INC AXILLA   US BREAST LTD UNI RIGHT INC AXILLA      1) Mammogram - recommend yearly screening mammogram. Diagnostic Mammogramordered today for lump at 2 o'clock left breast  2) STI screening was offered and declined  3) ASCCP guidelines and rational discussed.  Patient opts for every 5 years screening interval  4)  Colonoscopy -- start at 61   5) Routine healthcare maintenance including cholesterol, diabetes screening discussed To return fasting at a later date  6) Menorrhagia, will check CBC and TSH. Patient will consider pelvic US if desired.   Adrian Prows MD, [facility], [facility] 02/03/2021 4:12 PM

## 2021-02-06 LAB — CYTOLOGY - PAP
Comment: NEGATIVE
Diagnosis: NEGATIVE
High risk HPV: NEGATIVE

## 2021-02-09 ENCOUNTER — Other Ambulatory Visit: Payer: BC Managed Care – PPO

## 2021-02-09 ENCOUNTER — Other Ambulatory Visit: Payer: Self-pay

## 2021-02-09 DIAGNOSIS — N920 Excessive and frequent menstruation with regular cycle: Secondary | ICD-10-CM

## 2021-02-09 DIAGNOSIS — Z1329 Encounter for screening for other suspected endocrine disorder: Secondary | ICD-10-CM

## 2021-02-09 DIAGNOSIS — Z131 Encounter for screening for diabetes mellitus: Secondary | ICD-10-CM

## 2021-02-09 DIAGNOSIS — Z130 Encounter for screening for diseases of the blood and blood-forming organs and certain disorders involving the immune mechanism: Secondary | ICD-10-CM

## 2021-02-09 DIAGNOSIS — Z13220 Encounter for screening for lipoid disorders: Secondary | ICD-10-CM

## 2021-02-10 LAB — CBC WITH DIFFERENTIAL
Basophils Absolute: 0 10*3/uL (ref 0.0–0.2)
Basos: 1 %
EOS (ABSOLUTE): 0.1 10*3/uL (ref 0.0–0.4)
Eos: 2 %
Hematocrit: 28.4 % — ABNORMAL LOW (ref 34.0–46.6)
Hemoglobin: 8.5 g/dL — ABNORMAL LOW (ref 11.1–15.9)
Immature Grans (Abs): 0 10*3/uL (ref 0.0–0.1)
Immature Granulocytes: 0 %
Lymphocytes Absolute: 1.7 10*3/uL (ref 0.7–3.1)
Lymphs: 27 %
MCH: 21.7 pg — ABNORMAL LOW (ref 26.6–33.0)
MCHC: 29.9 g/dL — ABNORMAL LOW (ref 31.5–35.7)
MCV: 72 fL — ABNORMAL LOW (ref 79–97)
Monocytes Absolute: 0.4 10*3/uL (ref 0.1–0.9)
Monocytes: 7 %
Neutrophils Absolute: 3.9 10*3/uL (ref 1.4–7.0)
Neutrophils: 63 %
RBC: 3.92 x10E6/uL (ref 3.77–5.28)
RDW: 17.2 % — ABNORMAL HIGH (ref 11.7–15.4)
WBC: 6.1 10*3/uL (ref 3.4–10.8)

## 2021-02-10 LAB — BASIC METABOLIC PANEL
BUN/Creatinine Ratio: 7 — ABNORMAL LOW (ref 9–23)
BUN: 5 mg/dL — ABNORMAL LOW (ref 6–24)
CO2: 20 mmol/L (ref 20–29)
Calcium: 9.1 mg/dL (ref 8.7–10.2)
Chloride: 105 mmol/L (ref 96–106)
Creatinine, Ser: 0.69 mg/dL (ref 0.57–1.00)
Glucose: 97 mg/dL (ref 65–99)
Potassium: 4.6 mmol/L (ref 3.5–5.2)
Sodium: 137 mmol/L (ref 134–144)
eGFR: 112 mL/min/{1.73_m2} (ref >59)

## 2021-02-10 LAB — TSH: TSH: 3.04 u[IU]/mL (ref 0.450–4.500)

## 2021-02-10 LAB — LIPID PANEL
Chol/HDL Ratio: 4.4 ratio (ref 0.0–4.4)
Cholesterol, Total: 174 mg/dL (ref 100–199)
HDL: 40 mg/dL (ref >39)
LDL Chol Calc (NIH): 119 mg/dL — ABNORMAL HIGH (ref 0–99)
Triglycerides: 81 mg/dL (ref 0–149)
VLDL Cholesterol Cal: 15 mg/dL (ref 5–40)

## 2021-03-09 ENCOUNTER — Ambulatory Visit
Admission: RE | Admit: 2021-03-09 | Discharge: 2021-03-09 | Disposition: A | Discharge status: Home / Self Care | Payer: BC Managed Care – PPO | Source: Ambulatory Visit | Attending: Obstetrics and Gynecology | Admitting: Obstetrics and Gynecology

## 2021-03-09 ENCOUNTER — Other Ambulatory Visit: Payer: Self-pay

## 2021-03-09 DIAGNOSIS — N6321 Unspecified lump in the left breast, upper outer quadrant: Secondary | ICD-10-CM

## 2021-03-09 DIAGNOSIS — Z1231 Encounter for screening mammogram for malignant neoplasm of breast: Secondary | ICD-10-CM | POA: Diagnosis not present

## 2021-03-10 ENCOUNTER — Other Ambulatory Visit: Payer: Self-pay | Admitting: Obstetrics and Gynecology

## 2021-03-10 DIAGNOSIS — N6320 Unspecified lump in the left breast, unspecified quadrant: Secondary | ICD-10-CM

## 2021-03-10 DIAGNOSIS — N6310 Unspecified lump in the right breast, unspecified quadrant: Secondary | ICD-10-CM

## 2021-03-10 DIAGNOSIS — R928 Other abnormal and inconclusive findings on diagnostic imaging of breast: Secondary | ICD-10-CM

## 2021-03-10 NOTE — Progress Notes (Signed)
Orders signed, thank you

## 2021-03-13 ENCOUNTER — Ambulatory Visit
Admission: RE | Admit: 2021-03-13 | Discharge: 2021-03-13 | Disposition: A | Discharge status: Home / Self Care | Payer: BC Managed Care – PPO | Source: Ambulatory Visit | Attending: Obstetrics and Gynecology | Admitting: Obstetrics and Gynecology

## 2021-03-13 ENCOUNTER — Other Ambulatory Visit: Payer: Self-pay

## 2021-03-13 DIAGNOSIS — R928 Other abnormal and inconclusive findings on diagnostic imaging of breast: Secondary | ICD-10-CM | POA: Diagnosis present

## 2021-03-13 DIAGNOSIS — N6320 Unspecified lump in the left breast, unspecified quadrant: Secondary | ICD-10-CM | POA: Insufficient documentation

## 2021-03-13 DIAGNOSIS — N6310 Unspecified lump in the right breast, unspecified quadrant: Secondary | ICD-10-CM | POA: Insufficient documentation

## 2021-03-13 HISTORY — PX: BREAST BIOPSY: SHX20

## 2021-03-16 ENCOUNTER — Encounter: Payer: Self-pay | Admitting: *Deleted

## 2021-03-16 DIAGNOSIS — C50912 Malignant neoplasm of unspecified site of left female breast: Secondary | ICD-10-CM

## 2021-03-16 LAB — SURGICAL PATHOLOGY

## 2021-03-16 NOTE — Progress Notes (Signed)
Notified by Amanda Sniff, RN that patient had been informed of her biopsy results of new diagnosis of invasive mammary carcinoma.  Called patient to establish navigation services.  Patient request that she be seen as soon as possible.  I have scheduled her to be seen tomorrow by Dr. Tasia Catchings at 2:15 and Dr. Christian Mate at 3:45.  Will give educational material at that time.

## 2021-03-17 ENCOUNTER — Inpatient Hospital Stay: Payer: BC Managed Care – PPO

## 2021-03-17 ENCOUNTER — Ambulatory Visit: Payer: BC Managed Care – PPO | Admitting: Surgery

## 2021-03-17 ENCOUNTER — Encounter: Payer: Self-pay | Admitting: Oncology

## 2021-03-17 ENCOUNTER — Inpatient Hospital Stay: Payer: BC Managed Care – PPO | Attending: Oncology | Admitting: Oncology

## 2021-03-17 ENCOUNTER — Encounter: Payer: Self-pay | Admitting: *Deleted

## 2021-03-17 VITALS — BP 151/82 | HR 102 | Temp 99.6°F | Resp 16 | Wt 238.0 lb

## 2021-03-17 DIAGNOSIS — C50412 Malignant neoplasm of upper-outer quadrant of left female breast: Secondary | ICD-10-CM | POA: Insufficient documentation

## 2021-03-17 DIAGNOSIS — Z803 Family history of malignant neoplasm of breast: Secondary | ICD-10-CM | POA: Diagnosis not present

## 2021-03-17 DIAGNOSIS — C50912 Malignant neoplasm of unspecified site of left female breast: Secondary | ICD-10-CM

## 2021-03-17 DIAGNOSIS — D509 Iron deficiency anemia, unspecified: Secondary | ICD-10-CM

## 2021-03-17 DIAGNOSIS — Z170 Estrogen receptor positive status [ER+]: Secondary | ICD-10-CM | POA: Insufficient documentation

## 2021-03-17 LAB — COMPREHENSIVE METABOLIC PANEL
ALT: 12 U/L (ref 0–44)
AST: 15 U/L (ref 15–41)
Albumin: 4.1 g/dL (ref 3.5–5.0)
Alkaline Phosphatase: 43 U/L (ref 38–126)
Anion gap: 9 (ref 5–15)
BUN: 13 mg/dL (ref 6–20)
CO2: 21 mmol/L — ABNORMAL LOW (ref 22–32)
Calcium: 8.8 mg/dL — ABNORMAL LOW (ref 8.9–10.3)
Chloride: 105 mmol/L (ref 98–111)
Creatinine, Ser: 0.63 mg/dL (ref 0.44–1.00)
GFR, Estimated: >60 mL/min (ref >60)
Glucose, Bld: 98 mg/dL (ref 70–99)
Potassium: 3.6 mmol/L (ref 3.5–5.1)
Sodium: 135 mmol/L (ref 135–145)
Total Bilirubin: 0.7 mg/dL (ref 0.3–1.2)
Total Protein: 8.6 g/dL — ABNORMAL HIGH (ref 6.5–8.1)

## 2021-03-17 LAB — CBC WITH DIFFERENTIAL/PLATELET
Abs Immature Granulocytes: 0.02 10*3/uL (ref 0.00–0.07)
Basophils Absolute: 0 10*3/uL (ref 0.0–0.1)
Basophils Relative: 0 %
Eosinophils Absolute: 0.1 10*3/uL (ref 0.0–0.5)
Eosinophils Relative: 1 %
HCT: 30.2 % — ABNORMAL LOW (ref 36.0–46.0)
Hemoglobin: 8.9 g/dL — ABNORMAL LOW (ref 12.0–15.0)
Immature Granulocytes: 0 %
Lymphocytes Relative: 25 %
Lymphs Abs: 2.1 10*3/uL (ref 0.7–4.0)
MCH: 21.3 pg — ABNORMAL LOW (ref 26.0–34.0)
MCHC: 29.5 g/dL — ABNORMAL LOW (ref 30.0–36.0)
MCV: 72.2 fL — ABNORMAL LOW (ref 80.0–100.0)
Monocytes Absolute: 0.8 10*3/uL (ref 0.1–1.0)
Monocytes Relative: 9 %
Neutro Abs: 5.5 10*3/uL (ref 1.7–7.7)
Neutrophils Relative %: 65 %
Platelets: 457 10*3/uL — ABNORMAL HIGH (ref 150–400)
RBC: 4.18 MIL/uL (ref 3.87–5.11)
RDW: 17.1 % — ABNORMAL HIGH (ref 11.5–15.5)
WBC: 8.6 10*3/uL (ref 4.0–10.5)
nRBC: 0 % (ref 0.0–0.2)

## 2021-03-17 NOTE — Progress Notes (Signed)
New patient referred by Dr Gilman Schmidt for left breast cancer. Found during breast exam at GYN office.

## 2021-03-17 NOTE — Addendum Note (Signed)
Addended by: Earlie Server on: 03/17/2021 07:06 PM   Modules accepted: Orders

## 2021-03-17 NOTE — Progress Notes (Signed)
Hematology/Oncology Consult note [facility] Telephone:([phone] [phone]   Patient Care Team: Danelle Berry, NP as PCP - General (Nurse Practitioner) Rico Junker, RN as Oncology Nurse Navigator  REFERRING PROVIDER: Homero Fellers, *  CHIEF COMPLAINTS/REASON FOR VISIT:  Evaluation of left breast cancer  HISTORY OF PRESENTING ILLNESS:   Amanda Pearson is a  40 y.o.  female with PMH listed below was seen in consultation at the request of  Schuman, Christanna R, *  for evaluation of left breast cancer Patient was seen recently by gynecology for physical. There was a mass palpated in the left breast.  Patient was recommended to proceed with diagnostic mammogram 03/09/2021, bilateral diagnostic mammogram and ultrasound showed Left breast 0.9 x 0.6 x 1.1 cm slight lobulated hypoechoic mass at 2:00 11 cm from the nipple.  Ultrasound of the left axillary is negative Right breast 1 x 0.5x 0.7 cm 9:00 15 cm from nipple Patient was recommended for 87-monthfollow-up for needle biopsy.  Patient opted   have biopsy of both mass.  03/13/2021, patient went biopsy of both breast masses. 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  Grade 2 ER/PR/HER-2 status is pending.  Patient was referred to establish care with oncology.  She also has an appointment with Dr. RLutricia Feilnext week Patient was accompanied by her husband. She is very anxious and feels overwhelmed after receiving the news. Denies any breast pain, nipple discharge. Menarche  -89to 40years old She has no children. History of OCP use for about a year.  no hormone replacement therapy  LMP March 2022 Denies any previous biopsies Family history positive for paternal aunt was diagnosed with cancer in late 442s-early 571s                                                                                Review of Systems  Constitutional: Negative for appetite change, chills, fatigue and fever.  HENT:   Negative for hearing loss and voice change.   Eyes: Negative for eye problems.  Respiratory: Negative for chest tightness and cough.   Cardiovascular: Negative for chest pain.  Gastrointestinal: Negative for abdominal distention, abdominal pain and blood in stool.  Endocrine: Negative for hot flashes.  Genitourinary: Negative for difficulty urinating and frequency.   Musculoskeletal: Negative for arthralgias.  Skin: Negative for itching and rash.  Neurological: Negative for extremity weakness.  Hematological: Negative for adenopathy.  Psychiatric/Behavioral: Negative for confusion. The patient is nervous/anxious.     MEDICAL HISTORY:  Past Medical History:  Diagnosis Date  . Anemia   . Anemia   . Hypertension     SURGICAL HISTORY: Past Surgical History:  Procedure Laterality Date  . BREAST BIOPSY Left 03/13/2021   [place], q-clip, path pending  . BREAST BIOPSY Right 03/13/2021   [place], x-clip, path pending   . FACIAL COSMETIC SURGERY      SOCIAL HISTORY: Social History   Socioeconomic History  . Marital status: Married    Spouse name: Not on file  . Number of children: Not on file  . Years of education: Not on  file  . Highest education level: Not on file  Occupational History  . Not on file  Tobacco Use  . Smoking status: Never Smoker  . Smokeless tobacco: Never Used  Vaping Use  . Vaping Use: Never used  Substance and Sexual Activity  . Alcohol use: No  . Drug use: No  . Sexual activity: Yes  Other Topics Concern  . Not on file  Social History Narrative  . Not on file   Social Determinants of Health   Financial Resource Strain: Not on file  Food Insecurity: Not on file  Transportation Needs: Not on file  Physical Activity: Not on file  Stress: Not on file  Social Connections: Not on file  Intimate Partner Violence: Not on file    FAMILY  HISTORY: Family History  Problem Relation Age of Onset  . Breast cancer Paternal Aunt 36    ALLERGIES:  has No Known Allergies.  MEDICATIONS:  Current Outpatient Medications  Medication Sig Dispense Refill  . losartan (COZAAR) 50 MG tablet Take 50 mg by mouth daily.     No current facility-administered medications for this visit.     PHYSICAL EXAMINATION: ECOG PERFORMANCE STATUS: 0 - Asymptomatic Vitals:   03/17/21 1431  BP: (!) 151/82  Pulse: (!) 102  Resp: 16  Temp: 99.6 F (37.6 C)  SpO2: 100%   Filed Weights   03/17/21 1431  Weight: 238 lb (108 kg)    Physical Exam Constitutional:      General: She is not in acute distress. HENT:     Head: Normocephalic and atraumatic.  Eyes:     General: No scleral icterus. Cardiovascular:     Rate and Rhythm: Normal rate and regular rhythm.     Heart sounds: Normal heart sounds.  Pulmonary:     Effort: Pulmonary effort is normal. No respiratory distress.     Breath sounds: No wheezing.  Abdominal:     General: Bowel sounds are normal. There is no distension.     Palpations: Abdomen is soft.  Musculoskeletal:        General: No deformity. Normal range of motion.     Cervical back: Normal range of motion and neck supple.  Skin:    General: Skin is warm and dry.     Findings: No erythema or rash.  Neurological:     Mental Status: She is alert and oriented to person, place, and time. Mental status is at baseline.     Cranial Nerves: No cranial nerve deficit.     Coordination: Coordination normal.  Psychiatric:        Mood and Affect: Mood normal.   Breast exam was performed in seated and lying down position. Patient is status post breast mass biopsy.  Not able to appreciate the right breast mass. Palpable small pea-sized left upper outer quadrant mass.  No palpable axillary lymphadenopathy bilaterally.  LABORATORY DATA:  I have reviewed the data as listed Lab Results  Component Value Date   WBC 8.6 03/17/2021    HGB 8.9 (L) 03/17/2021   HCT 30.2 (L) 03/17/2021   MCV 72.2 (L) 03/17/2021   PLT 457 (H) 03/17/2021   Recent Labs    02/09/21 0844 03/17/21 1519  NA 137 135  K 4.6 3.6  CL 105 105  CO2 20 21*  GLUCOSE 97 98  BUN 5* 13  CREATININE 0.69 0.63  CALCIUM 9.1 8.8*  GFRNONAA  --  >60  PROT  --  8.6*  ALBUMIN  --  4.1  AST  --  15  ALT  --  12  ALKPHOS  --  43  BILITOT  --  0.7   Iron/TIBC/Ferritin/ %Sat No results found for: IRON, TIBC, FERRITIN, IRONPCTSAT    RADIOGRAPHIC STUDIES: I have personally reviewed the radiological images as listed and agreed with the findings in the report. US BREAST LTD UNI LEFT INC AXILLA  Result Date: 03/09/2021 CLINICAL DATA:  Palpable lump left breast EXAM: DIGITAL DIAGNOSTIC BILATERAL MAMMOGRAM WITH TOMOSYNTHESIS AND CAD; ULTRASOUND RIGHT BREAST LIMITED; ULTRASOUND LEFT BREAST LIMITED TECHNIQUE: Bilateral digital diagnostic mammography and breast tomosynthesis was performed. The images were evaluated with computer-aided detection.; Targeted ultrasound examination of the right breast was performed; Targeted ultrasound examination of the left breast was performed COMPARISON:  None ACR Breast Density Category b: There are scattered areas of fibroglandular density. FINDINGS: Cc and MLO views of bilateral breasts are submitted. There is a mass at the palpable area upper-outer quadrant left breast. There is a mass in the lateral slight lower right breast. Targeted ultrasound is performed, showing 0.9 x 0.6 x 1.1 cm slight lobulated hypoechoic mass at palpable area left breast 2 o'clock 11 cm from nipple with through transmission. This correlates to the mammographic mass. This may be a fibroadenoma. Ultrasound of the left axilla is negative. Ultrasound is performed in the right breast which demonstrated a minimal lobulated hypoechoic mass at right breast 9 o'clock 15 cm from nipple measuring 1 x 0.5 x 0.7 cm correlating to the mammographic mass. This may be a  fibroadenoma. Ultrasound of the right axilla is negative. IMPRESSION: Probable benign findings. RECOMMENDATION: I discussed the options of six-month follow-up, needle biopsy to establish diagnosis and surgical excision with the patient. The patient preferred needle biopsy in both breast to establish diagnosis. I have discussed the findings and recommendations with the patient. If applicable, a reminder letter will be sent to the patient regarding the next appointment. BI-RADS CATEGORY  3: Probably benign. Electronically Signed   By: Abelardo Diesel M.D.   On: 03/09/2021 16:08   US BREAST LTD UNI RIGHT INC AXILLA  Result Date: 03/09/2021 CLINICAL DATA:  Palpable lump left breast EXAM: DIGITAL DIAGNOSTIC BILATERAL MAMMOGRAM WITH TOMOSYNTHESIS AND CAD; ULTRASOUND RIGHT BREAST LIMITED; ULTRASOUND LEFT BREAST LIMITED TECHNIQUE: Bilateral digital diagnostic mammography and breast tomosynthesis was performed. The images were evaluated with computer-aided detection.; Targeted ultrasound examination of the right breast was performed; Targeted ultrasound examination of the left breast was performed COMPARISON:  None ACR Breast Density Category b: There are scattered areas of fibroglandular density. FINDINGS: Cc and MLO views of bilateral breasts are submitted. There is a mass at the palpable area upper-outer quadrant left breast. There is a mass in the lateral slight lower right breast. Targeted ultrasound is performed, showing 0.9 x 0.6 x 1.1 cm slight lobulated hypoechoic mass at palpable area left breast 2 o'clock 11 cm from nipple with through transmission. This correlates to the mammographic mass. This may be a fibroadenoma. Ultrasound of the left axilla is negative. Ultrasound is performed in the right breast which demonstrated a minimal lobulated hypoechoic mass at right breast 9 o'clock 15 cm from nipple measuring 1 x 0.5 x 0.7 cm correlating to the mammographic mass. This may be a fibroadenoma. Ultrasound of the  right axilla is negative. IMPRESSION: Probable benign findings. RECOMMENDATION: I discussed the options of six-month follow-up, needle biopsy to establish diagnosis and surgical excision with the patient. The patient preferred needle biopsy in both breast to establish diagnosis.  I have discussed the findings and recommendations with the patient. If applicable, a reminder letter will be sent to the patient regarding the next appointment. BI-RADS CATEGORY  3: Probably benign. Electronically Signed   By: Abelardo Diesel M.D.   On: 03/09/2021 16:08   MM DIAG BREAST TOMO BILATERAL  Result Date: 03/09/2021 CLINICAL DATA:  Palpable lump left breast EXAM: DIGITAL DIAGNOSTIC BILATERAL MAMMOGRAM WITH TOMOSYNTHESIS AND CAD; ULTRASOUND RIGHT BREAST LIMITED; ULTRASOUND LEFT BREAST LIMITED TECHNIQUE: Bilateral digital diagnostic mammography and breast tomosynthesis was performed. The images were evaluated with computer-aided detection.; Targeted ultrasound examination of the right breast was performed; Targeted ultrasound examination of the left breast was performed COMPARISON:  None ACR Breast Density Category b: There are scattered areas of fibroglandular density. FINDINGS: Cc and MLO views of bilateral breasts are submitted. There is a mass at the palpable area upper-outer quadrant left breast. There is a mass in the lateral slight lower right breast. Targeted ultrasound is performed, showing 0.9 x 0.6 x 1.1 cm slight lobulated hypoechoic mass at palpable area left breast 2 o'clock 11 cm from nipple with through transmission. This correlates to the mammographic mass. This may be a fibroadenoma. Ultrasound of the left axilla is negative. Ultrasound is performed in the right breast which demonstrated a minimal lobulated hypoechoic mass at right breast 9 o'clock 15 cm from nipple measuring 1 x 0.5 x 0.7 cm correlating to the mammographic mass. This may be a fibroadenoma. Ultrasound of the right axilla is negative. IMPRESSION:  Probable benign findings. RECOMMENDATION: I discussed the options of six-month follow-up, needle biopsy to establish diagnosis and surgical excision with the patient. The patient preferred needle biopsy in both breast to establish diagnosis. I have discussed the findings and recommendations with the patient. If applicable, a reminder letter will be sent to the patient regarding the next appointment. BI-RADS CATEGORY  3: Probably benign. Electronically Signed   By: Abelardo Diesel M.D.   On: 03/09/2021 16:08   MM CLIP PLACEMENT LEFT  Result Date: 03/13/2021 CLINICAL DATA:  Status post ultrasound-guided biopsy EXAM: DIAGNOSTIC LEFT MAMMOGRAM POST ULTRASOUND BIOPSY COMPARISON:  Previous exam(s). FINDINGS: Mammographic images were obtained following ultrasound guided biopsy of a LEFT breast mass. The Q biopsy marking clip is in expected position at the site of biopsy. IMPRESSION: Appropriate positioning of the Q shaped biopsy marking clip at the site of biopsy in the LEFT upper outer breast. Final Assessment: Post Procedure Mammograms for Marker Placement Electronically Signed   By: Valentino Saxon MD   On: 03/13/2021 09:20   MM CLIP PLACEMENT RIGHT  Result Date: 03/13/2021 CLINICAL DATA:  Status post ultrasound-guided biopsy EXAM: DIAGNOSTIC RIGHT MAMMOGRAM POST ULTRASOUND BIOPSY COMPARISON:  Previous exam(s). FINDINGS: Mammographic images were obtained following ultrasound guided biopsy of a RIGHT breast mass. The X biopsy marking clip is in expected position at the site of biopsy. IMPRESSION: Appropriate positioning of the X shaped biopsy marking clip at the site of biopsy in the RIGHT upper outer breast. Final Assessment: Post Procedure Mammograms for Marker Placement Electronically Signed   By: Valentino Saxon MD   On: 03/13/2021 09:20   [place] LT BREAST BX W LOC DEV 1ST LESION IMG BX SPEC US GUIDE  Addendum Date: 03/16/2021   ADDENDUM REPORT: 03/16/2021 11:49 ADDENDUM: PATHOLOGY revealed: Site A. BREAST,  LEFT AT 2:00; ULTRASOUND-GUIDED CORE NEEDLE BIOPSY: - INVASIVE MAMMARY CARCINOMA, WITH MUCINOUS FEATURES. Size of invasive carcinoma: At least 4 mm in this sample. Histologic grade of invasive carcinoma: Grade 2.  Ductal carcinoma in situ: Not identified. Lymphovascular invasion: Not identified. Pathology results are CONCORDANT with imaging findings, per Dr. Valentino Saxon. PATHOLOGY revealed: Site B. BREAST, RIGHT AT 9:00; ULTRASOUND-GUIDED CORE NEEDLE BIOPSY: - FRAGMENTS OF BENIGN FIBROADENOMA. - NEGATIVE FOR ATYPICAL PROLIFERATIVE BREAST DISEASE. Pathology results are CONCORDANT with imaging findings, per Dr. Valentino Saxon. Pathology results and recommendations below were discussed with patient by telephone on 03/16/2021. Patient reported biopsy site within normal limits with slight tenderness at the site. Post biopsy care instructions were reviewed, questions were answered and my direct phone number was provided to patient. Patient was instructed to call [facility] if any concerns or questions arise related to the biopsy. Recommendations: 1. Surgical consultation for site A only: Request for surgical consultation relayed to Al Pimple RN and Tanya Nones RN at [facility] by Electa Sniff RN on 03/16/2021. 2. Consider bilateral breast MRI given patient's age and increased lifetime risk of breast cancer due to family history. Pathology results reported by Electa Sniff RN on 03/16/2021. Electronically Signed   By: Ammie Ferrier M.D.   On: 03/16/2021 11:49   Result Date: 03/16/2021 CLINICAL DATA:  Bilateral breast ultrasound guided biopsy for BI-RADS 3 masses EXAM: ULTRASOUND GUIDED RIGHT BREAST CORE NEEDLE BIOPSY ULTRASOUND GUIDED LEFT BREAST CORE NEEDLE BIOPSY COMPARISON:  Previous exam(s). PROCEDURE: I met with the patient and we discussed the procedure of ultrasound-guided biopsy, including benefits and alternatives. We discussed the high likelihood of a successful procedure.  We discussed the risks of the procedure, including infection, bleeding, tissue injury, clip migration, and inadequate sampling. Informed written consent was given. The usual time-out protocol was performed immediately prior to the procedure. Site 1: RIGHT breast, 9:00 Lesion quadrant: Upper outer quadrant Using sterile technique and 1% lidocaine and 1% lidocaine with epinephrine as local anesthetic, under direct ultrasound visualization, a 14 gauge spring-loaded device was used to perform biopsy of a mass in the outer breast using an inferior approach. At the conclusion of the procedure an X tissue marker clip was deployed into the biopsy cavity. Follow up 2 view mammogram was performed and dictated separately. Site 2: LEFT breast 2 o'clock Lesion quadrant: Upper outer quadrant Using sterile technique and 1% lidocaine and 1% lidocaine with epinephrine as local anesthetic, under direct ultrasound visualization, a 14 gauge spring-loaded device was used to perform biopsy of a mass in the outer breast using an inferior approach. At the conclusion of the procedure a Q tissue marker clip was deployed into the biopsy cavity. Follow up 2 view mammogram was performed and dictated separately. IMPRESSION: Ultrasound guided biopsy of a RIGHT breast mass at 9 o'clock and a LEFT breast mass at 2 o'clock. No apparent complications. Electronically Signed: By: Valentino Saxon MD On: 03/13/2021 09:23   [place] RT BREAST BX W LOC DEV 1ST LESION IMG BX SPEC US GUIDE  Addendum Date: 03/16/2021   ADDENDUM REPORT: 03/16/2021 11:49 ADDENDUM: PATHOLOGY revealed: Site A. BREAST, LEFT AT 2:00; ULTRASOUND-GUIDED CORE NEEDLE BIOPSY: - INVASIVE MAMMARY CARCINOMA, WITH MUCINOUS FEATURES. Size of invasive carcinoma: At least 4 mm in this sample. Histologic grade of invasive carcinoma: Grade 2. Ductal carcinoma in situ: Not identified. Lymphovascular invasion: Not identified. Pathology results are CONCORDANT with imaging findings, per Dr.  Valentino Saxon. PATHOLOGY revealed: Site B. BREAST, RIGHT AT 9:00; ULTRASOUND-GUIDED CORE NEEDLE BIOPSY: - FRAGMENTS OF BENIGN FIBROADENOMA. - NEGATIVE FOR ATYPICAL PROLIFERATIVE BREAST DISEASE. Pathology results are CONCORDANT with imaging findings, per Dr. Valentino Saxon. Pathology results and recommendations  below were discussed with patient by telephone on 03/16/2021. Patient reported biopsy site within normal limits with slight tenderness at the site. Post biopsy care instructions were reviewed, questions were answered and my direct phone number was provided to patient. Patient was instructed to call [facility] if any concerns or questions arise related to the biopsy. Recommendations: 1. Surgical consultation for site A only: Request for surgical consultation relayed to Al Pimple RN and Tanya Nones RN at [facility] by Electa Sniff RN on 03/16/2021. 2. Consider bilateral breast MRI given patient's age and increased lifetime risk of breast cancer due to family history. Pathology results reported by Electa Sniff RN on 03/16/2021. Electronically Signed   By: Ammie Ferrier M.D.   On: 03/16/2021 11:49   Result Date: 03/16/2021 CLINICAL DATA:  Bilateral breast ultrasound guided biopsy for BI-RADS 3 masses EXAM: ULTRASOUND GUIDED RIGHT BREAST CORE NEEDLE BIOPSY ULTRASOUND GUIDED LEFT BREAST CORE NEEDLE BIOPSY COMPARISON:  Previous exam(s). PROCEDURE: I met with the patient and we discussed the procedure of ultrasound-guided biopsy, including benefits and alternatives. We discussed the high likelihood of a successful procedure. We discussed the risks of the procedure, including infection, bleeding, tissue injury, clip migration, and inadequate sampling. Informed written consent was given. The usual time-out protocol was performed immediately prior to the procedure. Site 1: RIGHT breast, 9:00 Lesion quadrant: Upper outer quadrant Using sterile technique and 1% lidocaine and 1%  lidocaine with epinephrine as local anesthetic, under direct ultrasound visualization, a 14 gauge spring-loaded device was used to perform biopsy of a mass in the outer breast using an inferior approach. At the conclusion of the procedure an X tissue marker clip was deployed into the biopsy cavity. Follow up 2 view mammogram was performed and dictated separately. Site 2: LEFT breast 2 o'clock Lesion quadrant: Upper outer quadrant Using sterile technique and 1% lidocaine and 1% lidocaine with epinephrine as local anesthetic, under direct ultrasound visualization, a 14 gauge spring-loaded device was used to perform biopsy of a mass in the outer breast using an inferior approach. At the conclusion of the procedure a Q tissue marker clip was deployed into the biopsy cavity. Follow up 2 view mammogram was performed and dictated separately. IMPRESSION: Ultrasound guided biopsy of a RIGHT breast mass at 9 o'clock and a LEFT breast mass at 2 o'clock. No apparent complications. Electronically Signed: By: Valentino Saxon MD On: 03/13/2021 09:23      ASSESSMENT & PLAN:  1. Malignant neoplasm of left female breast, unspecified estrogen receptor status, unspecified site of breast ([place])   2. Family history of breast cancer   3. Microcytic anemia    #Left breast cT1c cN0 breast cancer, ER/PR/HER-2 status pending. Pathology and images were reviewed and discussed with patient. Further management plan pending,  hormone receptor and HER-2/neu status most likely she will proceed with upfront lumpectomy with sentinel needle biopsy followed by radiation. Unless if she has triple negative breast cancer, neoadjuvant chemotherapy can be considered for T1c.  Adjuvant chemotherapy plan to be determined based on receptor status. Patient has a family history of breast cancer and personal history of breast cancer at young age.  Recommend genetic testing, recommend patient to have genetic testing done prior to the surgery if  she feels a positive test may change her preference of lumpectomy versus mastectomy.  Patient wants to postpone genetic testing after surgery.    # Microcytic anemia, check iron panel.   Check CBC, CMP, CA 15.3, [place] [address]  Orders Placed This Encounter  Procedures  . CBC with Differential/Platelet    Standing Status:   Future    Number of Occurrences:   1    Standing Expiration Date:   03/17/2022  . Comprehensive metabolic panel    Standing Status:   Future    Number of Occurrences:   1    Standing Expiration Date:   03/17/2022  . Cancer antigen 27.29    Standing Status:   Future    Number of Occurrences:   1    Standing Expiration Date:   03/17/2022  . Cancer antigen 15-3    Standing Status:   Future    Number of Occurrences:   1    Standing Expiration Date:   03/17/2022    All questions were answered. The patient knows to call the clinic with any problems questions or concerns.  cc Schuman, Christanna R, *    Return of visit: To be determined Thank you for this kind referral and the opportunity to participate in the care of this patient. A copy of today's note is routed to referring provider    Earlie Server, MD, PhD Hematology Oncology [facility] Pager- [phone] 03/17/2021

## 2021-03-17 NOTE — Progress Notes (Signed)
Met patient and her husband today during her initial medical oncology consult with Dr. Tasia Catchings.  Gave patient breast cancer educational literature, "My Breast Cancer Treatment Handbook" by Josephine Igo, RN.  Patient very anxious.  Surgical consult has been postponed until next Tuesday.  Molecular studies are still pending.  Final treatment plan to be determined.

## 2021-03-18 ENCOUNTER — Other Ambulatory Visit: Payer: Self-pay

## 2021-03-18 DIAGNOSIS — D509 Iron deficiency anemia, unspecified: Secondary | ICD-10-CM

## 2021-03-18 LAB — IRON AND TIBC
Iron: 31 ug/dL (ref 28–170)
Saturation Ratios: 6 % — ABNORMAL LOW (ref 10.4–31.8)
TIBC: 487 ug/dL — ABNORMAL HIGH (ref 250–450)
UIBC: 456 ug/dL

## 2021-03-18 LAB — FERRITIN: Ferritin: 16 ng/mL (ref 11–307)

## 2021-03-18 LAB — CANCER ANTIGEN 27.29: CA 27.29: 26.7 U/mL (ref 0.0–38.6)

## 2021-03-18 LAB — CANCER ANTIGEN 15-3: CA 15-3: 29.9 U/mL — ABNORMAL HIGH (ref 0.0–25.0)

## 2021-03-23 LAB — SURGICAL PATHOLOGY

## 2021-03-24 ENCOUNTER — Encounter: Payer: Self-pay | Admitting: Surgery

## 2021-03-24 ENCOUNTER — Other Ambulatory Visit: Payer: Self-pay

## 2021-03-24 ENCOUNTER — Telehealth: Payer: Self-pay

## 2021-03-24 ENCOUNTER — Encounter: Payer: Self-pay | Admitting: *Deleted

## 2021-03-24 ENCOUNTER — Ambulatory Visit: Payer: BC Managed Care – PPO | Admitting: Surgery

## 2021-03-24 VITALS — BP 161/104 | HR 99 | Temp 98.9°F | Ht 63.0 in | Wt 239.0 lb

## 2021-03-24 DIAGNOSIS — Z170 Estrogen receptor positive status [ER+]: Secondary | ICD-10-CM | POA: Diagnosis not present

## 2021-03-24 DIAGNOSIS — C50912 Malignant neoplasm of unspecified site of left female breast: Secondary | ICD-10-CM | POA: Diagnosis not present

## 2021-03-24 MED ORDER — VITRON-C 65-125 MG PO TABS: 1.0000 | ORAL_TABLET | Freq: Every day | ORAL | 2 refills | Status: DC

## 2021-03-24 NOTE — H&P (View-Only) (Signed)
Patient ID: Amanda Pearson, female   DOB: 08/29/1981, 40 y.o.   MRN: [id number]  Chief Complaint: Left breast cancer  History of Present Illness Amanda Pearson is a 40 y.o. female with mass identified on physical exam, confirmed on imaging and core biopsy obtained.  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  Ultrasound of the left breast was negative for abnormal lymph nodes.   Initial impressions were benignity, however the patient desired to have both areas of right and left breast biopsy.  Right breast biopsy did confirm benignity identifying a fibroadenoma.  However the left biopsy revealed the results above/below. Only family history includes a paternal aunt.  She took birth control for a year.  She is premenopausal, menstruating as of last month.  She is nulliparous.  She has no prior history of nipple discharge or breast skin changes.  She denies breast pain and does not perform breast exams.  Past Medical History Past Medical History:  Diagnosis Date  . Anemia   . Anemia   . Hypertension       Past Surgical History:  Procedure Laterality Date  . BREAST BIOPSY Left 03/13/2021   US Bx, q-clip, path pending  . BREAST BIOPSY Right 03/13/2021   US Bx, x-clip, path pending   . FACIAL COSMETIC SURGERY      No Known Allergies  Current Outpatient Medications  Medication Sig Dispense Refill  . Iron-Vitamin C (VITRON-C) 65-125 MG TABS Take 1 tablet by mouth daily. 30 tablet 2  . losartan (COZAAR) 50 MG tablet Take 50 mg by mouth daily.     No current facility-administered medications for this visit.    Family History Family History  Problem Relation Age of Onset  . COPD Mother   . Kidney disease Mother   . Multiple sclerosis Father   . Breast cancer Paternal Aunt 55      Social History Social History   Tobacco Use  . Smoking status: Never Smoker  . Smokeless tobacco: Never Used  Vaping Use  . Vaping Use: Never used  Substance Use Topics  . Alcohol use: No   . Drug use: No        Review of Systems  Constitutional: Negative.   HENT: Negative.   Eyes: Negative.   Respiratory: Negative.   Cardiovascular: Negative.   Gastrointestinal: Negative.   Genitourinary: Negative.   Skin: Negative.   Neurological: Negative.   Psychiatric/Behavioral: Negative.       Physical Exam Blood pressure (!) 161/104, pulse 99, temperature 98.9 F (37.2 C), temperature source Oral, height 5' 3" (1.6 m), weight 239 lb (108.4 kg), SpO2 98 %. Last Weight  Most recent update: 03/24/2021  9:18 AM   Weight  108.4 kg (239 lb)            CONSTITUTIONAL: Well developed, and nourished, appropriately responsive and aware without distress.   EYES: Sclera non-icteric.   EARS, NOSE, MOUTH AND THROAT: Mask worn.   Hearing is intact to voice.  NECK: Trachea is midline, and there is no jugular venous distension.  LYMPH NODES:  Lymph nodes in the neck are not enlarged. RESPIRATORY:  Lungs are clear, and breath sounds are equal bilaterally. Normal respiratory effort without pathologic use of accessory muscles. CARDIOVASCULAR: Heart is regular in rate and rhythm. GI: The abdomen is soft, nontender, and nondistended. There were no palpable masses. I did not appreciate hepatosplenomegaly. There were normal bowel sounds. GU: Generous breast size without definitive skin changes, distinctive nodularity or masses appreciated   aside from the vague density in the biopsy area as described above. MUSCULOSKELETAL:  Symmetrical muscle tone appreciated in all four extremities.    SKIN: Skin turgor is normal. No pathologic skin lesions appreciated.  NEUROLOGIC:  Motor and sensation appear grossly normal.  Cranial nerves are grossly without defect. PSYCH:  Alert and oriented to person, place and time. Affect is appropriate for situation.  Data Reviewed I have personally reviewed what is currently available of the patient's imaging, recent labs and medical records.   Labs:  CBC Latest  Ref Rng & Units 03/17/2021 02/09/2021 10/16/2017  WBC 4.0 - 10.5 K/uL 8.6 6.1 10.1  Hemoglobin 12.0 - 15.0 g/dL 8.9(L) 8.5(L) 8.6(L)  Hematocrit 36.0 - 46.0 % 30.2(L) 28.4(L) 28.8(L)  Platelets 150 - 400 K/uL 457(H) - 429   CMP Latest Ref Rng & Units 03/17/2021 02/09/2021 10/16/2017  Glucose 70 - 99 mg/dL 98 97 85  BUN 6 - 20 mg/dL 13 5(L) 15  Creatinine 0.44 - 1.00 mg/dL 0.63 0.69 0.74  Sodium 135 - 145 mmol/L 135 137 134(L)  Potassium 3.5 - 5.1 mmol/L 3.6 4.6 3.7  Chloride 98 - 111 mmol/L 105 105 104  CO2 22 - 32 mmol/L 21(L) 20 22  Calcium 8.9 - 10.3 mg/dL 8.8(L) 9.1 9.0  Total Protein 6.5 - 8.1 g/dL 8.6(H) - 8.1  Total Bilirubin 0.3 - 1.2 mg/dL 0.7 - 0.7  Alkaline Phos 38 - 126 U/L 43 - 46  AST 15 - 41 U/L 15 - 17  ALT 0 - 44 U/L 12 - 14   SURGICAL PATHOLOGY  * THIS IS AN ADDENDUM REPORT *  CASE: [id number]  PATIENT: Amanda Pearson  Surgical Pathology Report  *Addendum *   Reason for Addendum #1: Breast Biomarker Results   Specimen Submitted:  A. Breast, left   Clinical History: Palpable mass, baseline mammogram. Probably benign  mass. DDX: fibroadenoma, PASH, FCC. Please evaluate for malignancy.  Q-shaped clip placed following ultrasound guided biopsy of LEFT breast  at 2 o'clock     DIAGNOSIS:  A. BREAST, LEFT AT 2:00; ULTRASOUND-GUIDED CORE NEEDLE BIOPSY:  - INVASIVE MAMMARY CARCINOMA, WITH MUCINOUS FEATURES.   Size of invasive carcinoma: At least 4 mm in this sample  Histologic grade of invasive carcinoma: Grade 2            Glandular/tubular differentiation score: 3            Nuclear pleomorphism score: 2            Mitotic rate score: 1            Total score: 6  Ductal carcinoma in situ: Not identified  Lymphovascular invasion: Not identified   ER/PR/HER2: Immunohistochemistry will be performed on block A1, with  reflex to FISH for HER2 2+. The results will be reported in an addendum.   GROSS DESCRIPTION:   A. Labeled: Left breast 2:00  Received: Formalin  Time/date in fixative: Collected and placed in formalin 8:47 AM on  03/13/2021  Cold ischemic time: Less than 1 minute  Total fixation time: 8 hours  Core pieces: 1 core and multiple additional fragments  Size: 2.2 cm in length and 0.2 cm in diameter  Description: Received is a single core and fragments of yellow and pink  fibrofatty tissue. The additional fragments are 1.5 x 0.5 x 0.2 cm.  Ink color: Green  Entirely submitted in cassettes 1-2 with a core and multiple fragments  in cassette 1 and the remaining fragments in cassette   2.   RB 03/13/2021   Final Diagnosis performed by Heath Jones, MD.  Electronically signed  03/16/2021 9:57:20AM  The electronic signature indicates that the named Attending Pathologist  has evaluated the specimen  Technical component performed at [facility], [address], [place],  [place] [address] Lab: [phone] Dir: Sanjai Nagendra, MD, MMM  Professional component performed at [facility], [facility], [address], [place], [place] [address] Lab: [phone]  Dir: Tara C. Rubinas, MD   ADDENDUM:  CASE SUMMARY: BREAST BIOMARKER TESTS  TEST(S) PERFORMED:  Estrogen Receptor (ER) Status: POSITIVE      Percentage of cells with nuclear positivity: 51-90%      Average intensity of staining: Strong   Progesterone Receptor (PgR) Status: POSITIVE      Percentage of cells with nuclear positivity: 51-90%      Average intensity of staining: Strong   HER2 (by immunohistochemistry): NEGATIVE (Score 1+)   Cold Ischemia and Fixation Times: Meet requirements specified in latest  version of the ASCO/CAP guidelines    Imaging: Radiology review:   CLINICAL DATA:  Palpable lump left breast  EXAM: DIGITAL DIAGNOSTIC BILATERAL MAMMOGRAM WITH TOMOSYNTHESIS AND CAD; ULTRASOUND RIGHT BREAST LIMITED; ULTRASOUND LEFT BREAST LIMITED  TECHNIQUE: Bilateral digital diagnostic  mammography and breast tomosynthesis was performed. The images were evaluated with computer-aided detection.; Targeted ultrasound examination of the right breast was performed; Targeted ultrasound examination of the left breast was performed  COMPARISON:  None  ACR Breast Density Category b: There are scattered areas of fibroglandular density.  FINDINGS: Cc and MLO views of bilateral breasts are submitted. There is a mass at the palpable area upper-outer quadrant left breast. There is a mass in the lateral slight lower right breast.  Targeted ultrasound is performed, showing 0.9 x 0.6 x 1.1 cm slight lobulated hypoechoic mass at palpable area left breast 2 o'clock 11 cm from nipple with through transmission. This correlates to the mammographic mass. This may be a fibroadenoma. Ultrasound of the left axilla is negative.  Ultrasound is performed in the right breast which demonstrated a minimal lobulated hypoechoic mass at right breast 9 o'clock 15 cm from nipple measuring 1 x 0.5 x 0.7 cm correlating to the mammographic mass. This may be a fibroadenoma. Ultrasound of the right axilla is negative.  IMPRESSION: Probable benign findings.  RECOMMENDATION: I discussed the options of six-month follow-up, needle biopsy to establish diagnosis and surgical excision with the patient. The patient preferred needle biopsy in both breast to establish diagnosis.  I have discussed the findings and recommendations with the patient. If applicable, a reminder letter will be sent to the patient regarding the next appointment.  BI-RADS CATEGORY  3: Probably benign.   Electronically Signed   By: Wei-Chen  Lin M.D.   On: 03/09/2021 16:08  CLINICAL DATA:  Status post ultrasound-guided biopsy  EXAM: DIAGNOSTIC LEFT MAMMOGRAM POST ULTRASOUND BIOPSY  COMPARISON:  Previous exam(s).  FINDINGS: Mammographic images were obtained following ultrasound guided biopsy of a LEFT  breast mass. The Q biopsy marking clip is in expected position at the site of biopsy.  IMPRESSION: Appropriate positioning of the Q shaped biopsy marking clip at the site of biopsy in the LEFT upper outer breast.  Final Assessment: Post Procedure Mammograms for Marker Placement   Electronically Signed   By: Stephanie  Peacock MD  Within last 24 hrs: No results found.  Assessment    ER/PR positive/HER-2 negative left breast invasive mammary carcinoma, roughly 1 cm size. Normal ultrasound appearance and   axillary ultrasound. Patient Active Problem List   Diagnosis Date Noted  . Malignant neoplasm of left female breast ([place]) 03/17/2021  . Family history of breast cancer 03/17/2021    Plan    I discussed the available options with the patient. The risk of recurrence is similar between mastectomy and lumpectomy with radiation.  I also discussed that given the small size of the cancer would recommend localization lumpectomy with radiation to follow.  I also discussed that we would need to do a sentinel lymph node biopsy to check the nodes.   Explained to the patient that after her surgical treatment additional treatment will depend on her prognostic indicators and stage.   We did discuss alternatives to breast conservation therapy, mastectomy and immediate reconstruction.  I discussed risks of bleeding, infection, damage to surrounding tissues, having positive margins, needing further resection, damage to nerves causing arm numbness or difficulty raising arm, causing lymphedema in the arm; as well as anesthesia risks of MI, stroke, prolonged ventilation, pulmonary embolism, thrombosis and even death.   Patient was given the opportunity to ask questions and have them answered.  They would like to proceed with left breast RFID localized lumpectomy with sentinel lymph node biopsy.    Face-to-face time spent with the patient and accompanying care providers(if present) was 45 minutes, with  more than 50% of the time spent counseling, educating, and coordinating care of the patient.      Ronny Bacon M.D., FACS 03/24/2021, 11:29 AM

## 2021-03-24 NOTE — Telephone Encounter (Signed)
Patient informed and is agreeable to take Vitron C. Prescription sent to pharmacy.   Dr. Tasia Catchings when do you want pt to follow up? She was TBD and no appts is scheduled at this time.

## 2021-03-24 NOTE — Patient Instructions (Addendum)
Our surgery scheduler will contact you in the next 24 - 48 hours. When speaking with her please have your blue surgery sheet available. Please contact us if you have any questions or concerns.  Lumpectomy  A lumpectomy, sometimes called a partial mastectomy, is surgery to remove a cancerous tumor or mass (the lump) from a breast. It is a form of breast-conserving or breast-preservation surgery. This means that the cancerous tissue is removed but the breast remains intact. During a lumpectomy, the portion of the breast that contains the tumor is removed. Some normal tissue around the lump may be taken out to make sure that all of the tumor has been removed. Lymph nodes under your arm may also be removed and tested to find out if the cancer has spread. Lymph nodes are part of the body's disease-fighting system (immune system) and are usually the first place where breast cancer spreads. Tell a health care provider about:  Any allergies you have.  All medicines you are taking, including vitamins, herbs, eye drops, creams, and over-the-counter medicines.  Any problems you or family members have had with anesthetic medicines.  Any blood disorders you have.  Any surgeries you have had.  Any medical conditions you have.  Whether you are pregnant or may be pregnant. What are the risks? Generally, this is a safe procedure. However, problems may occur, including:  Bleeding.  Infection.  Allergic reaction to medicines.  Pain, swelling, weakness, or numbness in the arm on the side of your surgery.  Temporary swelling.  Change in the shape of the breast, particularly if a large portion is removed.  Scar tissue that forms at the surgical site and feels hard to the touch.  Blood clots. What happens before the procedure? Staying hydrated Follow instructions from your health care provider about hydration, which may include:  Up to 2 hours before the procedure - you may continue to drink  clear liquids, such as water, clear fruit juice, black coffee, and plain tea.   Eating and drinking restrictions Follow instructions from your health care provider about eating and drinking, which may include:  8 hours before the procedure - stop eating heavy meals or foods, such as meat, fried foods, or fatty foods.  6 hours before the procedure - stop eating light meals or foods, such as toast or cereal.  6 hours before the procedure - stop drinking milk or drinks that contain milk.  2 hours before the procedure - stop drinking clear liquids. Medicines Ask your health care provider about:  Changing or stopping your regular medicines. This is especially important if you are taking diabetes medicines or blood thinners.  Taking medicines such as aspirin and ibuprofen. These medicines can thin your blood. Do not take these medicines unless your health care provider tells you to take them.  Taking over-the-counter medicines, vitamins, herbs, and supplements. General instructions  Prior to surgery, your health care provider may do a procedure to locate and mark the tumor area in your breast (localization). This will help guide your surgeon to where the incision will be made. This may be done with: ? Imaging, such as a mammogram, ultrasound, or MRI. ? Insertion of a small wire, clip, or seed, or an implant that will reflect a radar signal.  You may have screening tests or exams to get baseline measurements of your arm. These can be compared to measurements done after surgery to monitor for swelling (lymphedema) that can develop after having lymph nodes removed.  Ask  your health care provider: ? How your surgery site will be marked. ? What steps will be taken to help prevent infection. These may include:  Washing skin with a germ-killing soap.  Taking antibiotic medicine.  Plan to have someone take you home from the hospital or clinic.  Plan to have a responsible adult care for you for  at least 24 hours after you leave the hospital or clinic. This is important. What happens during the procedure?  An IV will be inserted into one of your veins.  You will be given one or more of the following: ? A medicine to help you relax (sedative). ? A medicine to numb the area (local anesthetic). ? A medicine to make you fall asleep (general anesthetic).  Your health care provider will use a kind of electric scalpel that uses heat to reduce bleeding (electrocautery knife). A curved incision that follows the natural curve of your breast will be made. This type of incision will allow for minimal scarring and better healing.  The tumor will be removed along with some of the tissue around it. This will be sent to the lab for testing. Your health care provider may also remove lymph nodes at this time if needed.  If the tumor is close to the muscles over your chest, some muscle tissue may also be removed.  A small drain tube may be inserted into your breast area or armpit to collect fluid that may build up after surgery. This tube will be connected to a suction bulb on the outside of your body to remove the fluid.  The incision will be closed with stitches (sutures).  A bandage (dressing) may be placed over the incision. The procedure may vary among health care providers and hospitals.   What happens after the procedure?  Your blood pressure, heart rate, breathing rate, and blood oxygen level will be monitored until you leave the hospital or clinic.  You will be given medicine for pain as needed.  Your IV will be removed when you are able to eat and drink by mouth.  You will be encouraged to get up and walk as soon as you can. This is important to improve blood flow and breathing. Ask for help if you feel weak or unsteady.  You may have: ? A drain tube in place for 2-3 days to prevent a collection of blood (hematoma) from developing in the breast. You will be given instructions about  caring for the drain before you go home. ? A pressure bandage applied for 1-2 days to prevent bleeding or swelling. Your pressure bandage may look like a thick piece of fabric or an elastic wrap. Ask your health care provider how to care for your bandage at home.  You may be given a tight sleeve to wear over your arm on the side of your surgery. You should wear this sleeve as told by your health care provider.  Do not drive for 24 hours if you were given a sedative during your procedure. Summary  A lumpectomy, sometimes called a partial mastectomy, is surgery to remove a cancerous tumor or mass (the lump) from a breast.  During a lumpectomy, the portion of the breast that contains the tumor is removed. Lymph nodes under your arm may also be removed and tested to find out if the cancer has spread.  Plan to have someone take you home from the hospital or clinic.  You may have a drain tube in place for 2-3 days  to prevent a collection of blood (hematoma) from developing in the breast. You will be given instructions about caring for the drain before you go home. This information is not intended to replace advice given to you by your health care provider. Make sure you discuss any questions you have with your health care provider. Document Revised: 06/04/2019 Document Reviewed: 06/04/2019 Elsevier Patient Education  [place] This sheet gives you information about how to care for yourself after your procedure. Your health care provider may also give you more specific instructions. If you have problems or questions, contact your health care provider. What can I expect after the procedure? After the procedure, it is common to have:  Breast swelling.  Breast tenderness.  Stiffness in your arm or shoulder.  A change in the shape and feel of your breast.  Scar tissue that feels hard to the touch in the area where the lump was removed. Follow these  instructions at home: Medicines  Take over-the-counter and prescription medicines only as told by your health care provider.  If you were prescribed an antibiotic medicine, take it as told by your health care provider. Do not stop taking the antibiotic even if you start to feel better.  Ask your health care provider if the medicine prescribed to you: ? Requires you to avoid driving or using heavy machinery. ? Can cause constipation. You may need to take these actions to prevent or treat constipation:  Drink enough fluid to keep your urine pale yellow.  Take over-the-counter or prescription medicines.  Eat foods that are high in fiber, such as beans, whole grains, and fresh fruits and vegetables.  Limit foods that are high in fat and processed sugars, such as fried or sweet foods. Incision care  Follow instructions from your health care provider about how to take care of your incision. Make sure you: ? Wash your hands with soap and water before and after you change your bandage (dressing). If soap and water are not available, use hand sanitizer. ? Change your dressing as told by your health care provider. ? Leave stitches (sutures), skin glue, or adhesive strips in place. These skin closures may need to stay in place for 2 weeks or longer. If adhesive strip edges start to loosen and curl up, you may trim the loose edges. Do not remove adhesive strips completely unless your health care provider tells you to do that.  Check your incision area every day for signs of infection. Check for: ? More redness, swelling, or pain. ? Fluid or blood. ? Warmth. ? Pus or a bad smell.  Keep your dressing clean and dry.  If you were sent home with a surgical drain in place, follow instructions from your health care provider about emptying it.      Bathing  Do not take baths, swim, or use a hot tub until your health care provider approves.  Ask your health care provider if you may take showers.  You may only be allowed to take sponge baths. Activity  Rest as told by your health care provider.  Avoid sitting for a long time without moving. Get up to take short walks every 1-2 hours. This is important to improve blood flow and breathing. Ask for help if you feel weak or unsteady.  Return to your normal activities as told by your health care provider. Ask your health care provider what activities are safe for you.  Be careful to avoid  any activities that could cause an injury to your arm on the side of your surgery.  Do not lift anything that is heavier than 10 lb (4.5 kg), or the limit that you are told, until your health care provider says that it is safe. Avoid lifting with the arm that is on the side of your surgery.  Do not carry heavy objects on your shoulder on the side of your surgery.  Do exercises to keep your shoulder and arm from getting stiff and swollen. Talk with your health care provider about which exercises are safe for you. General instructions  Wear a supportive bra as told by your health care provider.  Raise (elevate) your arm above the level of your heart while you are sitting or lying down.  Do not wear tight jewelry on your arm, wrist, or fingers on the side of your surgery.  Keep all follow-up visits as told by your health care provider. This is important. ? You may need to be screened for extra fluid around the lymph nodes and swelling in the breast and arm (lymphedema). Follow instructions from your health care provider about how often you should be checked.  If you had any lymph nodes removed during your procedure, be sure to tell all of your health care providers. This is important information to share before you are involved in certain procedures, such as having blood tests or having your blood pressure taken. Contact a health care provider if:  You develop a rash.  You have a fever.  Your pain medicine is not working.  You have swelling,  weakness, or numbness in your arm that does not improve after a few weeks.  You have new swelling in your breast.  You have any of these signs of infection: ? More redness, swelling, or pain in your incision area. ? Fluid or blood coming from your incision. ? Warmth coming from the incision area. ? Pus or a bad smell coming from your incision. Get help right away if you have:  Very bad pain in your breast or arm.  Swelling in your legs or arms.  Redness, warmth, or pain in your leg or arm.  Chest pain.  Difficulty breathing. Summary  After the procedure, it is common to have breast tenderness, swelling in your breast, and stiffness in your arm and shoulder.  Follow instructions from your health care provider about how to take care of your incision.  Do not lift anything that is heavier than 10 lb (4.5 kg), or the limit that you are told, until your health care provider says that it is safe. Avoid lifting with the arm that is on the side of your surgery.  If you had any lymph nodes removed during your procedure, be sure to tell all of your health care providers. This is important information to share before you are involved in certain procedures, such as having blood tests or having your blood pressure taken. This information is not intended to replace advice given to you by your health care provider. Make sure you discuss any questions you have with your health care provider. Document Revised: 06/04/2019 Document Reviewed: 06/04/2019 Elsevier Patient Education  [place].

## 2021-03-24 NOTE — Telephone Encounter (Signed)
-----   Message from Earlie Server, MD sent at 03/23/2021 10:17 PM EDT ----- Please let patient know that her iron level is low. I recommend her to take oral iron supplementation. Vitron c daily. Please send her a Rx if she agrees. Thanks.

## 2021-03-24 NOTE — Telephone Encounter (Signed)
She will see me after her breast surgery. 2 week after

## 2021-03-24 NOTE — Progress Notes (Signed)
Called patient today to see if Dr. Christian Mate had given her the results of her molecular studies.  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  Surgery date is pending.  She is to let me know her surgery date and I will schedule her to follow up with Dr. Tasia Catchings.

## 2021-03-24 NOTE — Progress Notes (Signed)
Patient ID: Amanda Pearson, female   DOB: 1981-11-30, 40 y.o.   MRN: [id number]  Chief Complaint: Left breast cancer  History of Present Illness Amanda Pearson is a 40 y.o. female with mass identified on physical exam, confirmed on imaging and core biopsy obtained.  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  Ultrasound of the left breast was negative for abnormal lymph nodes.   Initial impressions were benignity, however the patient desired to have both areas of right and left breast biopsy.  Right breast biopsy did confirm benignity identifying a fibroadenoma.  However the left biopsy revealed the results above/below. Only family history includes a paternal aunt.  She took birth control for a year.  She is premenopausal, menstruating as of last month.  She is nulliparous.  She has no prior history of nipple discharge or breast skin changes.  She denies breast pain and does not perform breast exams.  Past Medical History Past Medical History:  Diagnosis Date  . Anemia   . Anemia   . Hypertension       Past Surgical History:  Procedure Laterality Date  . BREAST BIOPSY Left 03/13/2021   [place] Bx, q-clip, path pending  . BREAST BIOPSY Right 03/13/2021   [place] Bx, x-clip, path pending   . FACIAL COSMETIC SURGERY      No Known Allergies  Current Outpatient Medications  Medication Sig Dispense Refill  . Iron-Vitamin C (VITRON-C) 65-125 MG TABS Take 1 tablet by mouth daily. 30 tablet 2  . losartan (COZAAR) 50 MG tablet Take 50 mg by mouth daily.     No current facility-administered medications for this visit.    Family History Family History  Problem Relation Age of Onset  . COPD Mother   . Kidney disease Mother   . Multiple sclerosis Father   . Breast cancer Paternal Aunt 62      Social History Social History   Tobacco Use  . Smoking status: Never Smoker  . Smokeless tobacco: Never Used  Vaping Use  . Vaping Use: Never used  Substance Use Topics  . Alcohol use: No   . Drug use: No        Review of Systems  Constitutional: Negative.   HENT: Negative.   Eyes: Negative.   Respiratory: Negative.   Cardiovascular: Negative.   Gastrointestinal: Negative.   Genitourinary: Negative.   Skin: Negative.   Neurological: Negative.   Psychiatric/Behavioral: Negative.       Physical Exam Blood pressure (!) 161/104, pulse 99, temperature 98.9 F (37.2 C), temperature source Oral, height 5' 3" (1.6 m), weight 239 lb (108.4 kg), SpO2 98 %. Last Weight  Most recent update: 03/24/2021  9:18 AM   Weight  108.4 kg (239 lb)            CONSTITUTIONAL: Well developed, and nourished, appropriately responsive and aware without distress.   EYES: Sclera non-icteric.   EARS, NOSE, MOUTH AND THROAT: Mask worn.   Hearing is intact to voice.  NECK: Trachea is midline, and there is no jugular venous distension.  LYMPH NODES:  Lymph nodes in the neck are not enlarged. RESPIRATORY:  Lungs are clear, and breath sounds are equal bilaterally. Normal respiratory effort without pathologic use of accessory muscles. CARDIOVASCULAR: Heart is regular in rate and rhythm. GI: The abdomen is soft, nontender, and nondistended. There were no palpable masses. I did not appreciate hepatosplenomegaly. There were normal bowel sounds. GU: Generous breast size without definitive skin changes, distinctive nodularity or masses appreciated  aside from the vague density in the biopsy area as described above. MUSCULOSKELETAL:  Symmetrical muscle tone appreciated in all four extremities.    SKIN: Skin turgor is normal. No pathologic skin lesions appreciated.  NEUROLOGIC:  Motor and sensation appear grossly normal.  Cranial nerves are grossly without defect. PSYCH:  Alert and oriented to person, place and time. Affect is appropriate for situation.  Data Reviewed I have personally reviewed what is currently available of the patient's imaging, recent labs and medical records.   Labs:  CBC Latest  Ref Rng & Units 03/17/2021 02/09/2021 10/16/2017  WBC 4.0 - 10.5 K/uL 8.6 6.1 10.1  Hemoglobin 12.0 - 15.0 g/dL 8.9(L) 8.5(L) 8.6(L)  Hematocrit 36.0 - 46.0 % 30.2(L) 28.4(L) 28.8(L)  Platelets 150 - 400 K/uL 457(H) - 429   CMP Latest Ref Rng & Units 03/17/2021 02/09/2021 10/16/2017  Glucose 70 - 99 mg/dL 98 97 85  BUN 6 - 20 mg/dL 13 5(L) 15  Creatinine 0.44 - 1.00 mg/dL 0.63 0.69 0.74  Sodium 135 - 145 mmol/L 135 137 134(L)  Potassium 3.5 - 5.1 mmol/L 3.6 4.6 3.7  Chloride 98 - 111 mmol/L 105 105 104  CO2 22 - 32 mmol/L 21(L) 20 22  Calcium 8.9 - 10.3 mg/dL 8.8(L) 9.1 9.0  Total Protein 6.5 - 8.1 g/dL 8.6(H) - 8.1  Total Bilirubin 0.3 - 1.2 mg/dL 0.7 - 0.7  Alkaline Phos 38 - 126 U/L 43 - 46  AST 15 - 41 U/L 15 - 17  ALT 0 - 44 U/L 12 - 14   SURGICAL PATHOLOGY  * THIS IS AN ADDENDUM REPORT *  CASE: [id number]  PATIENT: [facility]  Surgical Pathology Report  *Addendum *   Reason for Addendum #1: Breast Biomarker Results   Specimen Submitted:  A. Breast, left   Clinical History: Palpable mass, baseline mammogram. Probably benign  mass. DDX: fibroadenoma, [place], [place]. Please evaluate for malignancy.  Q-shaped clip placed following ultrasound guided biopsy of LEFT breast  at 2 o'clock     DIAGNOSIS:  A. BREAST, LEFT AT 2:00; ULTRASOUND-GUIDED CORE NEEDLE BIOPSY:  - INVASIVE MAMMARY CARCINOMA, WITH MUCINOUS FEATURES.   Size of invasive carcinoma: At least 4 mm in this sample  Histologic grade of invasive carcinoma: Grade 2            Glandular/tubular differentiation score: 3            Nuclear pleomorphism score: 2            Mitotic rate score: 1            Total score: 6  Ductal carcinoma in situ: Not identified  Lymphovascular invasion: Not identified   ER/PR/HER2: Immunohistochemistry will be performed on block A1, with  reflex to [place] for HER2 2+. The results will be reported in an addendum.   GROSS DESCRIPTION:   A. Labeled: Left breast 2:00  Received: Formalin  Time/date in fixative: Collected and placed in formalin 8:47 AM on  03/13/2021  Cold ischemic time: Less than 1 minute  Total fixation time: 8 hours  Core pieces: 1 core and multiple additional fragments  Size: 2.2 cm in length and 0.2 cm in diameter  Description: Received is a single core and fragments of yellow and pink  fibrofatty tissue. The additional fragments are 1.5 x 0.5 x 0.2 cm.  Ink color: Green  Entirely submitted in cassettes 1-2 with a core and multiple fragments  in cassette 1 and the remaining fragments in cassette  2.   RB 03/13/2021   Final Diagnosis performed by Allena Napoleon, MD.  Electronically signed  03/16/2021 9:57:20AM  The electronic signature indicates that the named Attending Pathologist  has evaluated the specimen  Technical component performed at [facility], [address], [place],  [place] [address] Lab: [phone] Dir: Rush Farmer, MD, MMM  Professional component performed at [facility], [facility], [address], [place], [place] [address] Lab: [phone]  Dir: Dellia Nims. Rubinas, MD   ADDENDUM:  CASE SUMMARY: BREAST BIOMARKER TESTS  TEST(S) PERFORMED:  Estrogen Receptor (ER) Status: POSITIVE      Percentage of cells with nuclear positivity: 51-90%      Average intensity of staining: Strong   Progesterone Receptor (PgR) Status: POSITIVE      Percentage of cells with nuclear positivity: 51-90%      Average intensity of staining: Strong   HER2 (by immunohistochemistry): NEGATIVE (Score 1+)   Cold Ischemia and Fixation Times: Meet requirements specified in latest  version of the ASCO/CAP guidelines    Imaging: Radiology review:   CLINICAL DATA:  Palpable lump left breast  EXAM: DIGITAL DIAGNOSTIC BILATERAL MAMMOGRAM WITH TOMOSYNTHESIS AND CAD; ULTRASOUND RIGHT BREAST LIMITED; ULTRASOUND LEFT BREAST LIMITED  TECHNIQUE: Bilateral digital diagnostic  mammography and breast tomosynthesis was performed. The images were evaluated with computer-aided detection.; Targeted ultrasound examination of the right breast was performed; Targeted ultrasound examination of the left breast was performed  COMPARISON:  None  ACR Breast Density Category b: There are scattered areas of fibroglandular density.  FINDINGS: Cc and MLO views of bilateral breasts are submitted. There is a mass at the palpable area upper-outer quadrant left breast. There is a mass in the lateral slight lower right breast.  Targeted ultrasound is performed, showing 0.9 x 0.6 x 1.1 cm slight lobulated hypoechoic mass at palpable area left breast 2 o'clock 11 cm from nipple with through transmission. This correlates to the mammographic mass. This may be a fibroadenoma. Ultrasound of the left axilla is negative.  Ultrasound is performed in the right breast which demonstrated a minimal lobulated hypoechoic mass at right breast 9 o'clock 15 cm from nipple measuring 1 x 0.5 x 0.7 cm correlating to the mammographic mass. This may be a fibroadenoma. Ultrasound of the right axilla is negative.  IMPRESSION: Probable benign findings.  RECOMMENDATION: I discussed the options of six-month follow-up, needle biopsy to establish diagnosis and surgical excision with the patient. The patient preferred needle biopsy in both breast to establish diagnosis.  I have discussed the findings and recommendations with the patient. If applicable, a reminder letter will be sent to the patient regarding the next appointment.  BI-RADS CATEGORY  3: Probably benign.   Electronically Signed   By: Abelardo Diesel M.D.   On: 03/09/2021 16:08  CLINICAL DATA:  Status post ultrasound-guided biopsy  EXAM: DIAGNOSTIC LEFT MAMMOGRAM POST ULTRASOUND BIOPSY  COMPARISON:  Previous exam(s).  FINDINGS: Mammographic images were obtained following ultrasound guided biopsy of a LEFT  breast mass. The Q biopsy marking clip is in expected position at the site of biopsy.  IMPRESSION: Appropriate positioning of the Q shaped biopsy marking clip at the site of biopsy in the LEFT upper outer breast.  Final Assessment: Post Procedure Mammograms for Marker Placement   Electronically Signed   By: Valentino Saxon MD  Within last 24 hrs: No results found.  Assessment    ER/PR positive/HER-2 negative left breast invasive mammary carcinoma, roughly 1 cm size. Normal ultrasound appearance and  axillary ultrasound. Patient Active Problem List   Diagnosis Date Noted  . Malignant neoplasm of left female breast ([place]) 03/17/2021  . Family history of breast cancer 03/17/2021    Plan    I discussed the available options with the patient. The risk of recurrence is similar between mastectomy and lumpectomy with radiation.  I also discussed that given the small size of the cancer would recommend localization lumpectomy with radiation to follow.  I also discussed that we would need to do a sentinel lymph node biopsy to check the nodes.   Explained to the patient that after her surgical treatment additional treatment will depend on her prognostic indicators and stage.   We did discuss alternatives to breast conservation therapy, mastectomy and immediate reconstruction.  I discussed risks of bleeding, infection, damage to surrounding tissues, having positive margins, needing further resection, damage to nerves causing arm numbness or difficulty raising arm, causing lymphedema in the arm; as well as anesthesia risks of MI, stroke, prolonged ventilation, pulmonary embolism, thrombosis and even death.   Patient was given the opportunity to ask questions and have them answered.  They would like to proceed with left breast RFID localized lumpectomy with sentinel lymph node biopsy.    Face-to-face time spent with the patient and accompanying care providers(if present) was 45 minutes, with  more than 50% of the time spent counseling, educating, and coordinating care of the patient.      Ronny Bacon M.D., FACS 03/24/2021, 11:29 AM

## 2021-03-25 ENCOUNTER — Other Ambulatory Visit: Payer: Self-pay | Admitting: Surgery

## 2021-03-25 ENCOUNTER — Telehealth: Payer: Self-pay | Admitting: Surgery

## 2021-03-25 DIAGNOSIS — Z170 Estrogen receptor positive status [ER+]: Secondary | ICD-10-CM

## 2021-03-25 DIAGNOSIS — C50912 Malignant neoplasm of unspecified site of left female breast: Secondary | ICD-10-CM

## 2021-03-25 DIAGNOSIS — C50012 Malignant neoplasm of nipple and areola, left female breast: Secondary | ICD-10-CM

## 2021-03-25 NOTE — Telephone Encounter (Signed)
Left message for patient to call regarding scheduling of surgery.

## 2021-03-25 NOTE — Telephone Encounter (Signed)
Done..  I was unable to reach her by phone. A NEW appt reminder letter will be mail out.

## 2021-03-25 NOTE — Telephone Encounter (Signed)
Patient scheduled for breast lumpectomy on 4/29, please schedule MD 2 weeks after and notify pt of appt.

## 2021-03-26 ENCOUNTER — Telehealth: Payer: Self-pay | Admitting: Surgery

## 2021-03-26 NOTE — Telephone Encounter (Signed)
Patient has been advised of Pre-Admission date/time, COVID Testing date and Surgery date.  Surgery Date: 04/10/21 Preadmission Testing Date: 04/01/21 (phone 1p-5p) Covid Testing Date: not needed per pre-admit, patient has been fully vaccinated.    Patient has been made aware to arrive @ 9:30- am as she will be having  SLN injection prior to surgery same day on 04/10/21.   Patient also reminded of her RF tag that is scheduled at [facility] on 04/02/21.   Patient voices understanding with all.

## 2021-04-01 ENCOUNTER — Other Ambulatory Visit
Admission: RE | Admit: 2021-04-01 | Discharge: 2021-04-01 | Disposition: A | Discharge status: Home / Self Care | Payer: BC Managed Care – PPO | Source: Ambulatory Visit | Attending: Surgery | Admitting: Surgery

## 2021-04-01 ENCOUNTER — Other Ambulatory Visit: Payer: Self-pay

## 2021-04-01 DIAGNOSIS — Z01818 Encounter for other preprocedural examination: Secondary | ICD-10-CM | POA: Insufficient documentation

## 2021-04-01 HISTORY — DX: Malignant (primary) neoplasm, unspecified: C80.1

## 2021-04-01 HISTORY — DX: Anxiety disorder, unspecified: F41.9

## 2021-04-01 NOTE — Patient Instructions (Addendum)
Your procedure is scheduled on: 04/10/21 - Friday Report to the Registration Desk on the 1st floor of the [facility]. To find out your arrival time, please call [phone] between 1PM - 3PM on: 04/09/21 - Thursday Report to [facility] on 04/03/21 - 8 am - EKG/Bag  REMEMBER: Instructions that are not followed completely may result in serious medical risk, up to and including death; or upon the discretion of your surgeon and anesthesiologist your surgery may need to be rescheduled.  Do not eat food after midnight the night before surgery.  No gum chewing, lozengers or hard candies.  You may however, drink CLEAR liquids up to 2 hours before you are scheduled to arrive for your surgery. Do not drink anything within 2 hours of your scheduled arrival time.  Clear liquids include: - water  - apple juice without pulp - gatorade (not RED, PURPLE, OR BLUE) - black coffee or tea (Do NOT add milk or creamers to the coffee or tea) Do NOT drink anything that is not on this list.  TAKE THESE MEDICATIONS THE MORNING OF SURGERY WITH A SIP OF WATER: NONE  One week prior to surgery: Stop Anti-inflammatories (NSAIDS) such as Advil, Aleve, Ibuprofen, Motrin, Naproxen, Naprosyn and Aspirin based products such as Excedrin, Goodys Powder, BC Powder.  Stop ANY OVER THE COUNTER supplements until after surgery.  No Alcohol for 24 hours before or after surgery.  No Smoking including e-cigarettes for 24 hours prior to surgery.  No chewable tobacco products for at least 6 hours prior to surgery.  No nicotine patches on the day of surgery.  Do not use any "recreational" drugs for at least a week prior to your surgery.  Please be advised that the combination of cocaine and anesthesia may have negative outcomes, up to and including death. If you test positive for cocaine, your surgery will be cancelled.  On the morning of surgery brush your teeth with toothpaste and water, you may rinse your mouth with  mouthwash if you wish. Do not swallow any toothpaste or mouthwash.  Do not wear jewelry, make-up, hairpins, clips or nail polish.  Do not wear lotions, powders, or perfumes.   Do not shave body from the neck down 48 hours prior to surgery just in case you cut yourself which could leave a site for infection.  Also, freshly shaved skin may become irritated if using the CHG soap.  Contact lenses, hearing aids and dentures may not be worn into surgery.  Do not bring valuables to the hospital. [facility] is not responsible for any missing/lost belongings or valuables.   Use CHG Soap or wipes as directed on instruction sheet.  Notify your doctor if there is any change in your medical condition (cold, fever, infection).  Wear comfortable clothing (specific to your surgery type) to the hospital.  Plan for stool softeners for home use; pain medications have a tendency to cause constipation. You can also help prevent constipation by eating foods high in fiber such as fruits and vegetables and drinking plenty of fluids as your diet allows.  After surgery, you can help prevent lung complications by doing breathing exercises.  Take deep breaths and cough every 1-2 hours. Your doctor may order a device called an Incentive Spirometer to help you take deep breaths. When coughing or sneezing, hold a pillow firmly against your incision with both hands. This is called "splinting." Doing this helps protect your incision. It also decreases belly discomfort.  If you are being  admitted to the hospital overnight, leave your suitcase in the car. After surgery it may be brought to your room.  If you are being discharged the day of surgery, you will not be allowed to drive home. You will need a responsible adult (18 years or older) to drive you home and stay with you that night.   If you are taking public transportation, you will need to have a responsible adult (18 years or older) with you. Please confirm  with your physician that it is acceptable to use public transportation.   Please call the [facility] at [phone] if you have any questions about these instructions.  Surgery Visitation Policy:  Patients undergoing a surgery or procedure may have one family member or support person with them as long as that person is not COVID-19 positive or experiencing its symptoms.  That person may remain in the waiting area during the procedure.  Inpatient Visitation:    Visiting hours are 7 a.m. to 8 p.m. Inpatients will be allowed two visitors daily. The visitors may change each day during the patient's stay. No visitors under the age of 21. Any visitor under the age of 47 must be accompanied by an adult. The visitor must pass COVID-19 screenings, use hand sanitizer when entering and exiting the patient's room and wear a mask at all times, including in the patient's room. Patients must also wear a mask when staff or their visitor are in the room. Masking is required regardless of vaccination status.

## 2021-04-02 ENCOUNTER — Ambulatory Visit: Payer: Self-pay | Admitting: Surgery

## 2021-04-02 ENCOUNTER — Other Ambulatory Visit: Payer: Self-pay | Admitting: Surgery

## 2021-04-02 ENCOUNTER — Ambulatory Visit
Admission: RE | Admit: 2021-04-02 | Discharge: 2021-04-02 | Disposition: A | Discharge status: Home / Self Care | Payer: BC Managed Care – PPO | Source: Ambulatory Visit | Attending: Surgery | Admitting: Surgery

## 2021-04-02 ENCOUNTER — Ambulatory Visit: Admission: RE | Admit: 2021-04-02 | Payer: BC Managed Care – PPO | Source: Ambulatory Visit

## 2021-04-02 DIAGNOSIS — C50012 Malignant neoplasm of nipple and areola, left female breast: Secondary | ICD-10-CM | POA: Insufficient documentation

## 2021-04-03 ENCOUNTER — Ambulatory Visit: Payer: Self-pay | Admitting: Surgery

## 2021-04-03 ENCOUNTER — Other Ambulatory Visit
Admission: RE | Admit: 2021-04-03 | Discharge: 2021-04-03 | Disposition: A | Discharge status: Home / Self Care | Payer: BC Managed Care – PPO | Source: Ambulatory Visit | Attending: Surgery | Admitting: Surgery

## 2021-04-03 ENCOUNTER — Other Ambulatory Visit: Payer: Self-pay

## 2021-04-03 DIAGNOSIS — Z01810 Encounter for preprocedural cardiovascular examination: Secondary | ICD-10-CM | POA: Insufficient documentation

## 2021-04-03 DIAGNOSIS — C50912 Malignant neoplasm of unspecified site of left female breast: Secondary | ICD-10-CM

## 2021-04-08 ENCOUNTER — Encounter: Payer: Self-pay | Admitting: Surgery

## 2021-04-10 ENCOUNTER — Encounter
Admission: RE | Disposition: A | Discharge status: Home / Self Care | Payer: Self-pay | Source: Home / Self Care | Attending: Surgery

## 2021-04-10 ENCOUNTER — Encounter: Payer: Self-pay | Admitting: Surgery

## 2021-04-10 ENCOUNTER — Ambulatory Visit
Admission: RE | Admit: 2021-04-10 | Discharge: 2021-04-10 | Disposition: A | Discharge status: Home / Self Care | Payer: BC Managed Care – PPO | Attending: Surgery | Admitting: Surgery

## 2021-04-10 ENCOUNTER — Ambulatory Visit: Payer: BC Managed Care – PPO | Admitting: Urgent Care

## 2021-04-10 ENCOUNTER — Ambulatory Visit
Admission: RE | Admit: 2021-04-10 | Discharge: 2021-04-10 | Disposition: A | Discharge status: Home / Self Care | Payer: BC Managed Care – PPO | Source: Ambulatory Visit | Attending: Surgery | Admitting: Surgery

## 2021-04-10 ENCOUNTER — Other Ambulatory Visit: Payer: Self-pay

## 2021-04-10 ENCOUNTER — Encounter
Admission: RE | Admit: 2021-04-10 | Discharge: 2021-04-10 | Disposition: A | Discharge status: Home / Self Care | Payer: BC Managed Care – PPO | Source: Ambulatory Visit | Attending: Surgery | Admitting: Surgery

## 2021-04-10 DIAGNOSIS — C50912 Malignant neoplasm of unspecified site of left female breast: Secondary | ICD-10-CM

## 2021-04-10 DIAGNOSIS — C269 Malignant neoplasm of ill-defined sites within the digestive system: Secondary | ICD-10-CM | POA: Diagnosis not present

## 2021-04-10 DIAGNOSIS — D0512 Intraductal carcinoma in situ of left breast: Secondary | ICD-10-CM

## 2021-04-10 DIAGNOSIS — Z79899 Other long term (current) drug therapy: Secondary | ICD-10-CM | POA: Diagnosis not present

## 2021-04-10 DIAGNOSIS — C50412 Malignant neoplasm of upper-outer quadrant of left female breast: Secondary | ICD-10-CM | POA: Insufficient documentation

## 2021-04-10 DIAGNOSIS — Z170 Estrogen receptor positive status [ER+]: Secondary | ICD-10-CM

## 2021-04-10 DIAGNOSIS — Z803 Family history of malignant neoplasm of breast: Secondary | ICD-10-CM | POA: Diagnosis not present

## 2021-04-10 DIAGNOSIS — C7981 Secondary malignant neoplasm of breast: Secondary | ICD-10-CM

## 2021-04-10 HISTORY — PX: BREAST LUMPECTOMY,RADIO FREQ LOCALIZER,AXILLARY SENTINEL LYMPH NODE BIOPSY: SHX6900

## 2021-04-10 LAB — POCT PREGNANCY, URINE
Preg Test, Ur: NEGATIVE
Preg Test, Ur: NEGATIVE

## 2021-04-10 SURGERY — BREAST LUMPECTOMY,RADIO FREQ LOCALIZER,AXILLARY SENTINEL LYMPH NODE BIOPSY
Anesthesia: General | Laterality: Left

## 2021-04-10 MED ORDER — DEXAMETHASONE SODIUM PHOSPHATE 10 MG/ML IJ SOLN
INTRAMUSCULAR | Status: AC
  Filled 2021-04-10: qty 1

## 2021-04-10 MED ORDER — EPHEDRINE SULFATE 50 MG/ML IJ SOLN
INTRAMUSCULAR | Status: DC | PRN
  Administered 2021-04-10 (×2): 5 mg via INTRAVENOUS

## 2021-04-10 MED ORDER — PROMETHAZINE HCL 25 MG/ML IJ SOLN
6.2500 mg | INTRAMUSCULAR | Status: DC | PRN
  Administered 2021-04-10: 6.25 mg via INTRAVENOUS

## 2021-04-10 MED ORDER — LIDOCAINE HCL (PF) 2 % IJ SOLN
INTRAMUSCULAR | Status: AC
  Filled 2021-04-10: qty 5

## 2021-04-10 MED ORDER — LIDOCAINE HCL (CARDIAC) PF 100 MG/5ML IV SOSY
PREFILLED_SYRINGE | INTRAVENOUS | Status: DC | PRN
  Administered 2021-04-10: 80 mg via INTRAVENOUS

## 2021-04-10 MED ORDER — BUPIVACAINE LIPOSOME 1.3 % IJ SUSP
INTRAMUSCULAR | Status: AC
  Filled 2021-04-10: qty 20

## 2021-04-10 MED ORDER — ROCURONIUM BROMIDE 10 MG/ML (PF) SYRINGE
PREFILLED_SYRINGE | INTRAVENOUS | Status: AC
  Filled 2021-04-10: qty 10

## 2021-04-10 MED ORDER — GABAPENTIN 300 MG PO CAPS
ORAL_CAPSULE | ORAL | Status: AC
  Administered 2021-04-10: 300 mg
  Filled 2021-04-10: qty 1

## 2021-04-10 MED ORDER — FAMOTIDINE 20 MG PO TABS: 20.0000 mg | ORAL_TABLET | Freq: Once | ORAL | Status: DC

## 2021-04-10 MED ORDER — FENTANYL CITRATE (PF) 100 MCG/2ML IJ SOLN: 25.0000 ug | INTRAMUSCULAR | Status: DC | PRN

## 2021-04-10 MED ORDER — FENTANYL CITRATE (PF) 250 MCG/5ML IJ SOLN
INTRAMUSCULAR | Status: AC
  Filled 2021-04-10: qty 5

## 2021-04-10 MED ORDER — OXYCODONE HCL 5 MG/5ML PO SOLN: 5.0000 mg | Freq: Once | ORAL | Status: AC | PRN

## 2021-04-10 MED ORDER — CHLORHEXIDINE GLUCONATE 0.12 % MT SOLN
OROMUCOSAL | Status: AC
  Administered 2021-04-10: 15 mL
  Filled 2021-04-10: qty 15

## 2021-04-10 MED ORDER — ACETAMINOPHEN 500 MG PO TABS
ORAL_TABLET | ORAL | Status: AC
  Administered 2021-04-10: 1000 mg
  Filled 2021-04-10: qty 2

## 2021-04-10 MED ORDER — ISOSULFAN BLUE 1 % ~~LOC~~ SOLN
SUBCUTANEOUS | Status: AC
  Filled 2021-04-10: qty 5

## 2021-04-10 MED ORDER — BUPIVACAINE-EPINEPHRINE (PF) 0.25% -1:200000 IJ SOLN
INTRAMUSCULAR | Status: DC | PRN
  Administered 2021-04-10: 30 mL

## 2021-04-10 MED ORDER — PHENYLEPHRINE HCL (PRESSORS) 10 MG/ML IV SOLN
INTRAVENOUS | Status: DC | PRN
  Administered 2021-04-10: 100 ug via INTRAVENOUS
  Administered 2021-04-10: 200 ug via INTRAVENOUS
  Administered 2021-04-10 (×2): 100 ug via INTRAVENOUS

## 2021-04-10 MED ORDER — OXYCODONE HCL 5 MG PO TABS
5.0000 mg | ORAL_TABLET | Freq: Once | ORAL | Status: AC | PRN
  Administered 2021-04-10: 5 mg via ORAL

## 2021-04-10 MED ORDER — EPHEDRINE 5 MG/ML INJ
INTRAVENOUS | Status: AC
  Filled 2021-04-10: qty 10

## 2021-04-10 MED ORDER — SUCCINYLCHOLINE CHLORIDE 20 MG/ML IJ SOLN
INTRAMUSCULAR | Status: DC | PRN
  Administered 2021-04-10: 120 mg via INTRAVENOUS

## 2021-04-10 MED ORDER — FENTANYL CITRATE (PF) 100 MCG/2ML IJ SOLN
INTRAMUSCULAR | Status: AC
  Filled 2021-04-10: qty 2

## 2021-04-10 MED ORDER — MIDAZOLAM HCL 2 MG/2ML IJ SOLN
INTRAMUSCULAR | Status: DC | PRN
  Administered 2021-04-10: 2 mg via INTRAVENOUS

## 2021-04-10 MED ORDER — CEFAZOLIN SODIUM-DEXTROSE 2-4 GM/100ML-% IV SOLN
INTRAVENOUS | Status: AC
  Filled 2021-04-10: qty 100

## 2021-04-10 MED ORDER — CHLORHEXIDINE GLUCONATE CLOTH 2 % EX PADS: 6.0000 | MEDICATED_PAD | Freq: Once | CUTANEOUS | Status: DC

## 2021-04-10 MED ORDER — ONDANSETRON HCL 4 MG/2ML IJ SOLN
INTRAMUSCULAR | Status: DC | PRN
  Administered 2021-04-10: 4 mg via INTRAVENOUS

## 2021-04-10 MED ORDER — BUPIVACAINE-EPINEPHRINE (PF) 0.25% -1:200000 IJ SOLN
INTRAMUSCULAR | Status: AC
  Filled 2021-04-10: qty 30

## 2021-04-10 MED ORDER — DEXAMETHASONE SODIUM PHOSPHATE 10 MG/ML IJ SOLN
INTRAMUSCULAR | Status: DC | PRN
  Administered 2021-04-10: 8 mg via INTRAVENOUS

## 2021-04-10 MED ORDER — ONDANSETRON HCL 4 MG/2ML IJ SOLN
INTRAMUSCULAR | Status: AC
  Filled 2021-04-10: qty 2

## 2021-04-10 MED ORDER — CHLORHEXIDINE GLUCONATE 0.12 % MT SOLN: 15.0000 mL | Freq: Once | OROMUCOSAL | Status: DC

## 2021-04-10 MED ORDER — OXYCODONE HCL 5 MG PO TABS
ORAL_TABLET | ORAL | Status: AC
  Filled 2021-04-10: qty 1

## 2021-04-10 MED ORDER — ACETAMINOPHEN 500 MG PO TABS: 1000.0000 mg | ORAL_TABLET | ORAL | Status: DC

## 2021-04-10 MED ORDER — PROMETHAZINE HCL 25 MG/ML IJ SOLN
INTRAMUSCULAR | Status: AC
  Filled 2021-04-10: qty 1

## 2021-04-10 MED ORDER — PROPOFOL 10 MG/ML IV BOLUS
INTRAVENOUS | Status: DC | PRN
  Administered 2021-04-10: 160 mg via INTRAVENOUS

## 2021-04-10 MED ORDER — HYDROCODONE-ACETAMINOPHEN 5-325 MG PO TABS: 1.0000 | ORAL_TABLET | Freq: Four times a day (QID) | ORAL | 0 refills | Status: DC | PRN

## 2021-04-10 MED ORDER — LACTATED RINGERS IV SOLN: INTRAVENOUS | Status: DC

## 2021-04-10 MED ORDER — FENTANYL CITRATE (PF) 100 MCG/2ML IJ SOLN
INTRAMUSCULAR | Status: DC | PRN
  Administered 2021-04-10 (×6): 50 ug via INTRAVENOUS

## 2021-04-10 MED ORDER — TECHNETIUM TC 99M TILMANOCEPT KIT
1.0930 | PACK | Freq: Once | INTRAVENOUS | Status: AC | PRN
  Administered 2021-04-10: 1.093 via INTRADERMAL

## 2021-04-10 MED ORDER — SUCCINYLCHOLINE CHLORIDE 200 MG/10ML IV SOSY
PREFILLED_SYRINGE | INTRAVENOUS | Status: AC
  Filled 2021-04-10: qty 10

## 2021-04-10 MED ORDER — MIDAZOLAM HCL 2 MG/2ML IJ SOLN
INTRAMUSCULAR | Status: AC
  Filled 2021-04-10: qty 2

## 2021-04-10 MED ORDER — FAMOTIDINE 20 MG PO TABS
ORAL_TABLET | ORAL | Status: AC
  Administered 2021-04-10: 20 mg
  Filled 2021-04-10: qty 1

## 2021-04-10 MED ORDER — ORAL CARE MOUTH RINSE: 15.0000 mL | Freq: Once | OROMUCOSAL | Status: DC

## 2021-04-10 MED ORDER — FENTANYL CITRATE (PF) 100 MCG/2ML IJ SOLN
INTRAMUSCULAR | Status: AC
  Administered 2021-04-10: 25 ug via INTRAVENOUS
  Filled 2021-04-10: qty 2

## 2021-04-10 MED ORDER — CELECOXIB 200 MG PO CAPS: 200.0000 mg | ORAL_CAPSULE | ORAL | Status: DC

## 2021-04-10 MED ORDER — IBUPROFEN 800 MG PO TABS: 800.0000 mg | ORAL_TABLET | Freq: Three times a day (TID) | ORAL | 0 refills | Status: DC | PRN

## 2021-04-10 MED ORDER — CELECOXIB 200 MG PO CAPS
ORAL_CAPSULE | ORAL | Status: AC
  Administered 2021-04-10: 200 mg
  Filled 2021-04-10: qty 1

## 2021-04-10 MED ORDER — MEPERIDINE HCL 50 MG/ML IJ SOLN: 6.2500 mg | INTRAMUSCULAR | Status: DC | PRN

## 2021-04-10 MED ORDER — GABAPENTIN 300 MG PO CAPS: 300.0000 mg | ORAL_CAPSULE | ORAL | Status: DC

## 2021-04-10 MED ORDER — ISOSULFAN BLUE 1 % ~~LOC~~ SOLN
SUBCUTANEOUS | Status: DC | PRN
  Administered 2021-04-10: 5 mg via SUBCUTANEOUS

## 2021-04-10 MED ORDER — CEFAZOLIN SODIUM-DEXTROSE 2-4 GM/100ML-% IV SOLN
2.0000 g | INTRAVENOUS | Status: AC
  Administered 2021-04-10: 2 g via INTRAVENOUS

## 2021-04-10 SURGICAL SUPPLY — 42 items
ADH SKN CLS APL DERMABOND .7 (GAUZE/BANDAGES/DRESSINGS) ×1
APL PRP STRL LF DISP 70% ISPRP (MISCELLANEOUS) ×1
APPLIER CLIP 9.375 SM OPEN (CLIP)
APR CLP SM 9.3 20 MLT OPN (CLIP)
BLADE SURG 15 STRL LF DISP TIS (BLADE) ×1 IMPLANT
BLADE SURG 15 STRL SS (BLADE) ×2
CANISTER SUCT 1200ML W/VALVE (MISCELLANEOUS) IMPLANT
CHLORAPREP W/TINT 26 (MISCELLANEOUS) ×2 IMPLANT
CLIP APPLIE 9.375 SM OPEN (CLIP) IMPLANT
CNTNR SPEC 2.5X3XGRAD LEK (MISCELLANEOUS)
CONT SPEC 4OZ STER OR WHT (MISCELLANEOUS)
CONT SPEC 4OZ STRL OR WHT (MISCELLANEOUS)
CONTAINER SPEC 2.5X3XGRAD LEK (MISCELLANEOUS) IMPLANT
COVER WAND RF STERILE (DRAPES) ×2 IMPLANT
DECANTER SPIKE VIAL GLASS SM (MISCELLANEOUS) ×2 IMPLANT
DERMABOND ADVANCED (GAUZE/BANDAGES/DRESSINGS) ×1
DERMABOND ADVANCED .7 DNX12 (GAUZE/BANDAGES/DRESSINGS) ×1 IMPLANT
DEVICE DUBIN SPECIMEN MAMMOGRA (MISCELLANEOUS) ×2 IMPLANT
DRAPE LAPAROTOMY TRNSV 106X77 (MISCELLANEOUS) ×2 IMPLANT
ELECT CAUTERY BLADE TIP 2.5 (TIP) ×2
ELECT REM PT RETURN 9FT ADLT (ELECTROSURGICAL) ×2
ELECTRODE CAUTERY BLDE TIP 2.5 (TIP) ×1 IMPLANT
ELECTRODE REM PT RTRN 9FT ADLT (ELECTROSURGICAL) ×1 IMPLANT
GLOVE SURG ORTHO LTX SZ7.5 (GLOVE) ×14 IMPLANT
GOWN STRL REUS W/ TWL LRG LVL3 (GOWN DISPOSABLE) ×4 IMPLANT
GOWN STRL REUS W/TWL LRG LVL3 (GOWN DISPOSABLE) ×8
KIT MARKER MARGIN INK (KITS) ×2 IMPLANT
KIT TURNOVER KIT A (KITS) ×2 IMPLANT
MANIFOLD NEPTUNE II (INSTRUMENTS) ×2 IMPLANT
NEEDLE HYPO 22GX1.5 SAFETY (NEEDLE) ×2 IMPLANT
PACK BASIN MINOR ARMC (MISCELLANEOUS) ×2 IMPLANT
SET LOCALIZER 20 PROBE US (MISCELLANEOUS) ×2 IMPLANT
SET WALTER ACTIVATION W/DRAPE (SET/KITS/TRAYS/PACK) IMPLANT
SLEVE PROBE SENORX GAMMA FIND (MISCELLANEOUS) ×2 IMPLANT
SUT MNCRL 4-0 (SUTURE) ×2
SUT MNCRL 4-0 27XMFL (SUTURE) ×1
SUT VIC AB 3-0 SH 27 (SUTURE) ×2
SUT VIC AB 3-0 SH 27X BRD (SUTURE) ×1 IMPLANT
SUTURE MNCRL 4-0 27XMF (SUTURE) ×1 IMPLANT
SYR 10ML LL (SYRINGE) ×2 IMPLANT
SYR 20ML LL LF (SYRINGE) ×2 IMPLANT
WATER STERILE IRR 1000ML POUR (IV SOLUTION) ×2 IMPLANT

## 2021-04-10 NOTE — Anesthesia Postprocedure Evaluation (Signed)
Anesthesia Post Note  Patient: Kandise Riehle  Procedure(s) Performed: BREAST LUMPECTOMY,RADIO FREQ LOCALIZER,AXILLARY SENTINEL LYMPH NODE BIOPSY (Left )  Patient location during evaluation: PACU Anesthesia Type: General Level of consciousness: awake and alert and oriented Pain management: pain level controlled Vital Signs Assessment: post-procedure vital signs reviewed and stable Respiratory status: spontaneous breathing, nonlabored ventilation and respiratory function stable Cardiovascular status: blood pressure returned to baseline and stable Postop Assessment: no signs of nausea or vomiting Anesthetic complications: no   No complications documented.   Last Vitals:  Vitals:   04/10/21 1415 04/10/21 1432  BP: 133/83 (!) 143/82  Pulse: 94 100  Resp: 11 14  Temp: (!) 36.2 C (!) 36.2 C  SpO2: 95% 100%    Last Pain:  Vitals:   04/10/21 1432  TempSrc: Temporal  PainSc: 6                  Aayla Marrocco

## 2021-04-10 NOTE — Anesthesia Procedure Notes (Signed)
Procedure Name: Intubation Date/Time: 04/10/2021 11:39 AM Performed by: Demetrius Charity, CRNA Pre-anesthesia Checklist: Patient identified, Patient being monitored, Timeout performed, Emergency Drugs available and Suction available Patient Re-evaluated:Patient Re-evaluated prior to induction Oxygen Delivery Method: Circle system utilized Preoxygenation: Pre-oxygenation with 100% oxygen Induction Type: IV induction Ventilation: Mask ventilation without difficulty Laryngoscope Size: 3 and McGraph Grade View: Grade II Tube type: Oral Tube size: 7.0 mm Number of attempts: 1 Airway Equipment and Method: Stylet Placement Confirmation: ETT inserted through vocal cords under direct vision,  positive ETCO2 and breath sounds checked- equal and bilateral Secured at: 22 cm Tube secured with: Tape Dental Injury: Teeth and Oropharynx as per pre-operative assessment

## 2021-04-10 NOTE — Op Note (Signed)
  Pre-operative Diagnosis: Breast Cancer, left upper outer quadrant.   Post-operative Diagnosis: Same  Surgeon: Ronny Bacon, M.D., [facility]  Anesthesia: General  Procedure: Left lumpectomy, RFID tag directed, sentinel node biopsy  Procedure Details  The patient was seen again in the Holding Room. The benefits, complications, treatment options, and expected outcomes were discussed with the patient. The risks of bleeding, infection, recurrence of symptoms, failure to resolve symptoms, hematoma, seroma, open wound, cosmetic deformity, and the need for further surgery were discussed.  The patient was taken to Operating Room, identified as Amanda Pearson and the procedure verified.  A Time Out was held and the above information confirmed.  Prior to the induction of general anesthesia, antibiotic prophylaxis was administered. VTE prophylaxis was in place. The patient was positioned in the supine position. Appropriate anesthesia was then administered and tolerated well. The LOCALizer is used to mark the skin for incision.  A visual dye Isosulfan Blue 4 ml was injected periareolar dermis early under aseptic conditions.  Massage was administered to this area for 5 minutes prior to securely taping it.  The chest was prepped with Chloraprep and draped in the sterile fashion.  Then using the hand-held probe an area of high counts was identified in the axilla, an incision was made and direction by the probe aided in dissection of  lymph nodes which were sent for permanent section.  Blue lymphatics were followed to the end lymph nodes, and search with the probe assured that no nodes with counts higher than 10% of the highest in vivo sentinel node count or left behind.  There were no palpable lymph nodes left behind.  Attention was turned to the RFID tag localization site where an incision was made. Dissection using the LOCALizer to perform a lumpectomy with adequate margins was performed. This was done with  electrocautery and sharp dissection with Mayo scissors. There was minimal bleeding, and the cavity packed.  The specimen was taken to the back table and painted to demarcate the 6 surfaces of potential margin.  I returned to the cavity to remove the packing, and hemostasis was confirmed with electrocautery.   Once assuring that hemostasis was adequate and checked multiple times the wound was closed with interrupted 3-0 Vicryl followed by 4-0 subcuticular Monocryl sutures.  The axillary wound was closed in a similar fashion. Dermabond is utilized to seal the incision.  Depots of local anesthesia were injected into both areas.  Quarter percent Marcaine with epinephrine were utilized.   Findings: Faxitron imaging: Markers were centrally located within the specimen.  Estimated Blood Loss: Minimal         Drains: None         Specimens: Upper outer quadrant left breast tissue.       Complications: None.         Condition: Stable  Sentinel Node Biopsy Synoptic Operative Report  Operation performed with curative intent:Yes  Tracer(s) used to identify sentinel nodes in the upfront surgery (non-neoadjuvant) setting (select all that apply):Dye and Radioactive Tracer  Tracer(s) used to identify sentinel nodes in the neoadjuvant setting (select all that apply):N/A  All nodes (colored or non-colored) present at the end of a dye-filled lymphatic channel were removed:Yes   All significantly radioactive nodes were removed:Yes  All palpable suspicious nodes were removed:Yes  Biopsy-proven positive nodes marked with clips prior to chemotherapy were identified and removed:N/A    Ronny Bacon, M.D., [facility]  04/10/2021 ; 1:29 PM

## 2021-04-10 NOTE — Interval H&P Note (Signed)
History and Physical Interval Note:  04/10/2021 11:08 AM  Amanda Pearson  has presented today for surgery, with the diagnosis of left breast cancer.  The various methods of treatment have been discussed with the patient and family. After consideration of risks, benefits and other options for treatment, the patient has consented to  Procedure(s): [name] (Left) as a surgical intervention.  The patient's history has been reviewed, patient examined, no change in status, stable for surgery.  I have reviewed the patient's chart and labs.  Questions were answered to the patient's satisfaction.   The left breast is marked.   Ronny Bacon

## 2021-04-10 NOTE — Discharge Instructions (Signed)

## 2021-04-10 NOTE — Transfer of Care (Signed)
Immediate Anesthesia Transfer of Care Note  Patient: Amanda Pearson  Procedure(s) Performed: BREAST [place] SENTINEL LYMPH NODE BIOPSY (Left )  Patient Location: PACU  Anesthesia Type:General  Level of Consciousness: awake, alert  and oriented  Airway & Oxygen Therapy: Patient Spontanous Breathing and Patient connected to face mask oxygen  Post-op Assessment: Report given to RN and Post -op Vital signs reviewed and stable  Post vital signs: Reviewed and stable  Last Vitals:  Vitals Value Taken Time  BP    Temp    Pulse 109 04/10/21 1332  Resp 16 04/10/21 1332  SpO2 100 % 04/10/21 1332  Vitals shown include unvalidated device data.  Last Pain:  Vitals:   04/10/21 1009  TempSrc: Temporal  PainSc: 0-No pain         Complications: No complications documented.

## 2021-04-10 NOTE — Anesthesia Preprocedure Evaluation (Signed)
Anesthesia Evaluation  Patient identified by MRN, date of birth, ID band Patient awake    Reviewed: Allergy & Precautions, NPO status , Patient's Chart, lab work & pertinent test results  History of Anesthesia Complications Negative for: history of anesthetic complications  Airway Mallampati: III  TM Distance: >3 FB Neck ROM: Full    Dental no notable dental hx.    Pulmonary neg pulmonary ROS, neg sleep apnea, neg COPD,    breath sounds clear to auscultation- rhonchi (-) wheezing      Cardiovascular Exercise Tolerance: Good hypertension, Pt. on medications (-) CAD, (-) Past MI, (-) Cardiac Stents and (-) CABG  Rhythm:Regular Rate:Normal - Systolic murmurs and - Diastolic murmurs    Neuro/Psych neg Seizures negative neurological ROS  negative psych ROS   GI/Hepatic negative GI ROS, Neg liver ROS,   Endo/Other  negative endocrine ROSneg diabetes  Renal/GU negative Renal ROS     Musculoskeletal negative musculoskeletal ROS (+)   Abdominal (+) + obese,   Peds  Hematology  (+) anemia ,   Anesthesia Other Findings Past Medical History: No date: Anemia No date: Anemia No date: Anxiety No date: Cancer ([place]) No date: Hypertension   Reproductive/Obstetrics                             Anesthesia Physical Anesthesia Plan  ASA: II  Anesthesia Plan: General   Post-op Pain Management:    Induction: Intravenous  PONV Risk Score and Plan: 2 and Ondansetron, Dexamethasone and Midazolam  Airway Management Planned: Oral ETT  Additional Equipment:   Intra-op Plan:   Post-operative Plan: Extubation in OR  Informed Consent: I have reviewed the patients History and Physical, chart, labs and discussed the procedure including the risks, benefits and alternatives for the proposed anesthesia with the patient or authorized representative who has indicated his/her understanding and acceptance.      Dental advisory given  Plan Discussed with: CRNA and Anesthesiologist  Anesthesia Plan Comments:         Anesthesia Quick Evaluation

## 2021-04-14 ENCOUNTER — Telehealth: Payer: Self-pay | Admitting: *Deleted

## 2021-04-14 NOTE — Telephone Encounter (Signed)
Faxed F[employer] For Duty Form to [employer] Management Specialist at [phone]

## 2021-04-16 ENCOUNTER — Other Ambulatory Visit: Payer: Self-pay | Admitting: Oncology

## 2021-04-16 LAB — SURGICAL PATHOLOGY

## 2021-04-21 ENCOUNTER — Other Ambulatory Visit: Payer: Self-pay

## 2021-04-21 ENCOUNTER — Encounter: Payer: Self-pay | Admitting: Surgery

## 2021-04-21 ENCOUNTER — Ambulatory Visit (INDEPENDENT_AMBULATORY_CARE_PROVIDER_SITE_OTHER): Payer: BC Managed Care – PPO | Admitting: Surgery

## 2021-04-21 VITALS — BP 166/112 | HR 108 | Temp 98.9°F | Ht 63.0 in | Wt 238.0 lb

## 2021-04-21 DIAGNOSIS — Z170 Estrogen receptor positive status [ER+]: Secondary | ICD-10-CM

## 2021-04-21 DIAGNOSIS — C50412 Malignant neoplasm of upper-outer quadrant of left female breast: Secondary | ICD-10-CM

## 2021-04-21 NOTE — Progress Notes (Signed)
[facility] POST-OP OFFICE VISIT  04/21/2021  HPI: Amanda Pearson is a 40 y.o. female 11 days s/p left upper outer quadrant RF ID lumpectomy with sentinel lymph node biopsy.  She has excellent range of motion, does feel some tightness when she reaches.  Minimal to no pain.  No issues with her incision, fevers or chills.  We reviewed the pathology report as noted below.   SURGICAL PATHOLOGY  CASE: [id number]  PATIENT: [facility]  Surgical Pathology Report   Specimen Submitted:  A. Breast, left  B. Lymph node, 1,2,3,4, left axillary   Clinical History: Left breast cancer   DIAGNOSIS:  A. 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  - SEE CANCER SUMMARY BELOW.   B. LYMPH NODES #1-4, LEFT AXILLARY; EXCISIONAL BIOPSY:  - FOUR LYMPH NODES, NEGATIVE FOR METASTATIC CARCINOMA (0/4).   CANCER CASE SUMMARY: INVASIVE CARCINOMA OF THE BREAST  Standard(s): AJCC-UICC 8   SPECIMEN  Procedure: Lumpectomy  Specimen Laterality: Left   TUMOR  Histologic Type: Mucinous carcinoma  Histologic Grade (Nottingham Histologic Score)            Glandular (Acinar)/Tubular Differentiation: 3            Nuclear Pleomorphism: 2            Mitotic Rate: 1            Overall Grade: 2  Tumor Size: 14 x 6 x 6 mm  Ductal Carcinoma In Situ (DCIS): Present, intermediate grade  Lymphovascular Invasion: Not identified  Treatment Effect in the Breast: No known presurgical treatment   MARGINS  Margin Status for Invasive Carcinoma: All margins negative for invasive  carcinoma            Distance from closest margin: 2 mm            Specify closest margin: Anterior   Margin Status for DCIS: All margins negative for DCIS            Distance from DCIS to closest margin: 3 mm            Specify closest margin: Superior    REGIONAL LYMPH NODES  Regional Lymph  Node Status: All regional lymph nodes negative for tumor            Total Number of Lymph Nodes Examined (sentinel and  non-sentinel): 4            Number of Sentinel Nodes Examined: 4   DISTANT METASTASIS  Distant Site(s) Involved, if applicable: Not applicable   PATHOLOGIC STAGE CLASSIFICATION (pTNM, AJCC 8th Edition):  TNM Descriptors: Not applicable  DJ2E  Regional Lymph Nodes Modifier: sn  pN0  pM - Not applicable   SPECIAL STUDIES  Breast Biomarker Testing Performed on Previous Biopsy: [id number]   Estrogen Receptor (ER) Status: POSITIVE      Percentage of cells with nuclear positivity: 51-90%      Average intensity of staining: Strong   Progesterone Receptor (PgR) Status: POSITIVE      Percentage of cells with nuclear positivity: 51-90%      Average intensity of staining: Strong   HER2 (by immunohistochemistry): NEGATIVE (Score 1+)  Vital signs: BP (!) 166/112   Pulse (!) 108   Temp 98.9 F (37.2 C)   Ht _0  (1.6 m)   Wt 238 lb (108 kg)   LMP 03/25/2021   SpO2 97%   BMI 42.16 kg/m    Physical Exam:  Constitutional: She appears well. Left breast appears to be without deformity, swelling or mass. Skin: Incisions of the upper outer quadrant and axillary area are clean, dry and intact.  Assessment/Plan: This is a 40 y.o. 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    Patient Active Problem List   Diagnosis Date Noted  . Breast cancer, stage 1, estrogen receptor positive, left ([place]) 03/17/2021  . Family history of breast cancer 03/17/2021    -We discussed the anticipation of estrogen blockade, and XRT for local control.  She has follow-up with her oncologist, and we have made a referral to Dr. Baruch Gouty for radiation oncology. We will anticipate seeing her back in 6 months with new diagnostic baseline mammogram of her left breast. Will be glad to see her  back as needed.  Ronny Bacon M.D., FACS 04/21/2021, 10:38 AM

## 2021-04-21 NOTE — Patient Instructions (Addendum)
To see Dr Tasia Catchings, Oncology, on Monday for discussion.   We will see you back in September with a mammogram of the Left breast prior. We will send you a letter about these appointments.

## 2021-04-27 ENCOUNTER — Inpatient Hospital Stay: Payer: BC Managed Care – PPO | Attending: Oncology | Admitting: Oncology

## 2021-04-27 ENCOUNTER — Encounter: Payer: Self-pay | Admitting: *Deleted

## 2021-04-27 ENCOUNTER — Encounter: Payer: Self-pay | Admitting: Oncology

## 2021-04-27 ENCOUNTER — Telehealth: Payer: Self-pay

## 2021-04-27 ENCOUNTER — Ambulatory Visit
Admission: RE | Admit: 2021-04-27 | Discharge: 2021-04-27 | Disposition: A | Discharge status: Home / Self Care | Payer: BC Managed Care – PPO | Source: Ambulatory Visit | Attending: Radiation Oncology | Admitting: Radiation Oncology

## 2021-04-27 ENCOUNTER — Encounter: Payer: Self-pay | Admitting: Radiation Oncology

## 2021-04-27 VITALS — BP 163/95 | HR 96 | Temp 98.6°F | Resp 18 | Wt 244.6 lb

## 2021-04-27 VITALS — BP 163/95 | HR 96 | Temp 98.6°F | Resp 20 | Wt 244.0 lb

## 2021-04-27 DIAGNOSIS — D500 Iron deficiency anemia secondary to blood loss (chronic): Secondary | ICD-10-CM | POA: Diagnosis not present

## 2021-04-27 DIAGNOSIS — Z803 Family history of malignant neoplasm of breast: Secondary | ICD-10-CM | POA: Insufficient documentation

## 2021-04-27 DIAGNOSIS — C50412 Malignant neoplasm of upper-outer quadrant of left female breast: Secondary | ICD-10-CM | POA: Insufficient documentation

## 2021-04-27 DIAGNOSIS — D509 Iron deficiency anemia, unspecified: Secondary | ICD-10-CM | POA: Diagnosis not present

## 2021-04-27 DIAGNOSIS — D649 Anemia, unspecified: Secondary | ICD-10-CM | POA: Insufficient documentation

## 2021-04-27 DIAGNOSIS — I10 Essential (primary) hypertension: Secondary | ICD-10-CM | POA: Diagnosis not present

## 2021-04-27 DIAGNOSIS — Z170 Estrogen receptor positive status [ER+]: Secondary | ICD-10-CM | POA: Insufficient documentation

## 2021-04-27 DIAGNOSIS — Z791 Long term (current) use of non-steroidal anti-inflammatories (NSAID): Secondary | ICD-10-CM | POA: Insufficient documentation

## 2021-04-27 MED ORDER — VITRON-C 65-125 MG PO TABS: 1.0000 | ORAL_TABLET | Freq: Every day | ORAL | 2 refills | Status: DC

## 2021-04-27 NOTE — Progress Notes (Signed)
Met patient and her husband today during her post surgery follow up with Dr. Tasia Catchings.  Dr. Tasia Catchings is planning to send specimen off for Oncotype Dx testing.  Radiation to start after final results.  She is to follow up with Dr. Tasia Catchings after radiation unless Oncotype comes back as high risk of recurrence.  She is to call with any questions or needs.

## 2021-04-27 NOTE — Progress Notes (Signed)
Hematology/Oncology Consult note [facility] [phone] Fax:([phone]   Patient Care Team: Danelle Berry, NP as PCP - General (Nurse Practitioner) Rico Junker, RN as Oncology Nurse Navigator  REFERRING PROVIDER: Danelle Berry, NP  CHIEF COMPLAINTS/REASON FOR VISIT:  Evaluation of left breast cancer  HISTORY OF PRESENTING ILLNESS:   Amanda Pearson is a  40 y.o.  female with PMH listed below was seen in consultation at the request of  Danelle Berry, NP  for evaluation of left breast cancer Patient was seen recently by gynecology for physical. There was a mass palpated in the left breast.  Patient was recommended to proceed with diagnostic mammogram 03/09/2021, bilateral diagnostic mammogram and ultrasound showed Left breast 0.9 x 0.6 x 1.1 cm slight lobulated hypoechoic mass at 2:00 11 cm from the nipple.  Ultrasound of the left axillary is negative Right breast 1 x 0.5x 0.7 cm 9:00 15 cm from nipple Patient was recommended for 30-monthfollow-up for needle biopsy.  Patient opted   have biopsy of both mass.  03/13/2021, patient went biopsy of both breast masses. 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  Grade 2 ER/PR/HER-2 status is pending.  Patient was referred to establish care with oncology.  She also has an appointment with Dr. RLutricia Feilnext week Patient was accompanied by her husband. She is very anxious and feels overwhelmed after receiving the news. Denies any breast pain, nipple discharge. Menarche  -89to 40years old She has no children. History of OCP use for about a year.  no hormone replacement therapy  LMP March 2022 Denies any previous biopsies Family history positive for paternal aunt was diagnosed with cancer in late 461s-early 542s                                                                        INTERVAL  HISTORY Amanda Pearson a 40y.o. female who has above history reviewed by me today presents for follow up visit for management of stage I ER/PR positive, HER2 negative breast cancer. Problems and complaints are listed below: 04/10/2021, left breast lumpectomy pathology showed invasive mucinous carcinoma, DCIS, 4 left axillary lymph node was excised and 0 positive for pregnancy. pT1c pN0  Patient presents to discuss results and further management plan.  She was accompanied by her husband. Reports no new complaints.  Lumpectomy site is healing well.  Review of Systems  Constitutional: Negative for appetite change, chills, fatigue and fever.  HENT:   Negative for hearing loss and voice change.   Eyes: Negative for eye problems.  Respiratory: Negative for chest tightness and cough.   Cardiovascular: Negative for chest pain.  Gastrointestinal: Negative for abdominal distention, abdominal pain and blood in stool.  Endocrine: Negative for hot flashes.  Genitourinary: Negative for difficulty urinating and frequency.   Musculoskeletal: Negative for arthralgias.  Skin: Negative for itching and rash.  Neurological: Negative for extremity weakness.  Hematological: Negative for adenopathy.  Psychiatric/Behavioral: Negative for confusion. The patient is not nervous/anxious.     MEDICAL HISTORY:  Past Medical History:  Diagnosis Date  . Anemia   . Anemia   . Anxiety   .  Cancer ([place])   . Hypertension     SURGICAL HISTORY: Past Surgical History:  Procedure Laterality Date  . BREAST BIOPSY Left 03/13/2021   [place] Bx, q-clip, IMC with mucinous features  . BREAST BIOPSY Right 03/13/2021   [place] Bx, x-clip, fibroadenoma  . BREAST LUMPECTOMY,RADIO FREQ LOCALIZER,AXILLARY SENTINEL LYMPH NODE BIOPSY Left 04/10/2021   Procedure: BREAST LUMPECTOMY,RADIO FREQ LOCALIZER,AXILLARY SENTINEL LYMPH NODE BIOPSY;  Surgeon: Ronny Bacon, MD;  Location: [facility] ORS;  Service: General;  Laterality: Left;  . FACIAL  COSMETIC SURGERY      SOCIAL HISTORY: Social History   Socioeconomic History  . Marital status: Married    Spouse name: Fredderick  . Number of children: Not on file  . Years of education: Not on file  . Highest education level: Not on file  Occupational History  . Not on file  Tobacco Use  . Smoking status: Never Smoker  . Smokeless tobacco: Never Used  Vaping Use  . Vaping Use: Never used  Substance and Sexual Activity  . Alcohol use: No  . Drug use: No  . Sexual activity: Yes  Other Topics Concern  . Not on file  Social History Narrative  . Not on file   Social Determinants of Health   Financial Resource Strain: Not on file  Food Insecurity: Not on file  Transportation Needs: Not on file  Physical Activity: Not on file  Stress: Not on file  Social Connections: Not on file  Intimate Partner Violence: Not on file    FAMILY HISTORY: Family History  Problem Relation Age of Onset  . COPD Mother   . Kidney disease Mother   . Multiple sclerosis Father   . Breast cancer Paternal Aunt 56    ALLERGIES:  has No Known Allergies.  MEDICATIONS:  Current Outpatient Medications  Medication Sig Dispense Refill  . Cholecalciferol (VITAMIN D-3) 125 MCG (5000 UT) TABS Take 2 tablets by mouth daily.    Marland Kitchen ibuprofen (ADVIL) 800 MG tablet Take 1 tablet (800 mg total) by mouth every 8 (eight) hours as needed. 30 tablet 0  . Iron-Vitamin C (VITRON-C) 65-125 MG TABS Take 1 tablet by mouth daily. 30 tablet 2  . losartan (COZAAR) 50 MG tablet Take 100 mg by mouth daily.     No current facility-administered medications for this visit.     PHYSICAL EXAMINATION: ECOG PERFORMANCE STATUS: 0 - Asymptomatic Vitals:   04/27/21 1018  BP: (!) 163/95  Pulse: 96  Resp: 18  Temp: 98.6 F (37 C)   Filed Weights   04/27/21 1018  Weight: 244 lb 9.6 oz (110.9 kg)    Physical Exam Constitutional:      General: She is not in acute distress. HENT:     Head: Normocephalic and  atraumatic.  Eyes:     General: No scleral icterus. Cardiovascular:     Rate and Rhythm: Normal rate and regular rhythm.     Heart sounds: Normal heart sounds.  Pulmonary:     Effort: Pulmonary effort is normal. No respiratory distress.     Breath sounds: No wheezing.  Abdominal:     General: Bowel sounds are normal. There is no distension.     Palpations: Abdomen is soft.  Musculoskeletal:        General: No deformity. Normal range of motion.     Cervical back: Normal range of motion and neck supple.  Skin:    General: Skin is warm and dry.     Findings: No erythema or  rash.  Neurological:     Mental Status: She is alert and oriented to person, place, and time. Mental status is at baseline.     Cranial Nerves: No cranial nerve deficit.     Coordination: Coordination normal.  Psychiatric:        Mood and Affect: Mood normal.   Breast exam was performed in seated and lying down position. Patient is status post breast mass biopsy.  Not able to appreciate the right breast mass. Palpable small pea-sized left upper outer quadrant mass.  No palpable axillary lymphadenopathy bilaterally.  LABORATORY DATA:  I have reviewed the data as listed Lab Results  Component Value Date   WBC 8.6 03/17/2021   HGB 8.9 (L) 03/17/2021   HCT 30.2 (L) 03/17/2021   MCV 72.2 (L) 03/17/2021   PLT 457 (H) 03/17/2021   Recent Labs    02/09/21 0844 03/17/21 1519  NA 137 135  K 4.6 3.6  CL 105 105  CO2 20 21*  GLUCOSE 97 98  BUN 5* 13  CREATININE 0.69 0.63  CALCIUM 9.1 8.8*  GFRNONAA  --  >60  PROT  --  8.6*  ALBUMIN  --  4.1  AST  --  15  ALT  --  12  ALKPHOS  --  43  BILITOT  --  0.7   Iron/TIBC/Ferritin/ %Sat    Component Value Date/Time   IRON 31 03/17/2021 1519   TIBC 487 (H) 03/17/2021 1519   FERRITIN 16 03/17/2021 1519   IRONPCTSAT 6 (L) 03/17/2021 1519      RADIOGRAPHIC STUDIES: I have personally reviewed the radiological images as listed and agreed with the findings in  the report. NM Sentinel Node Inj-No Rpt (Breast)  Result Date: 04/10/2021 Sulfur colloid was injected by the nuclear medicine technologist for melanoma sentinel node.   MM BREAST SURGICAL SPECIMEN  Result Date: 04/10/2021 CLINICAL DATA:  Status post RF ID tag localized LEFT breast lumpectomy. EXAM: SPECIMEN RADIOGRAPH OF THE LEFT BREAST COMPARISON:  Previous exam(s). FINDINGS: Status post excision of the LEFT breast. The radiofrequency ID tag and Q shaped clip are present within the specimen. Q clip is at [place]. IMPRESSION: Specimen radiograph of the LEFT breast. Electronically Signed   By: Valentino Saxon MD   On: 04/10/2021 13:05   MM DIAG BREAST TOMO UNI LEFT  Result Date: 04/02/2021 CLINICAL DATA:  Post radiofrequency device localization under ultrasound guidance. EXAM: DIGITAL DIAGNOSTIC UNILATERAL LEFT MAMMOGRAM WITH TOMOSYNTHESIS AND CAD TECHNIQUE: Left digital diagnostic mammography and breast tomosynthesis was performed. The images were evaluated with computer-aided detection. COMPARISON:  Previous exam(s). ACR Breast Density Category b: There are scattered areas of fibroglandular density. FINDINGS: Two view mammography demonstrates presence of radiofrequency devise within the left breast 2 o'clock mass, adjacent to the Q shaped post biopsy marker. IMPRESSION: Successful placement of radiofrequency device within the left breast 2 o'clock biopsy-proven invasive mammary carcinoma, prior to left breast lumpectomy. RECOMMENDATION: Planned surgical treatment. I have discussed the findings and recommendations with the patient. If applicable, a reminder letter will be sent to the patient regarding the next appointment. BI-RADS CATEGORY  6: Known biopsy-proven malignancy. Electronically Signed   By: Fidela Salisbury M.D.   On: 04/02/2021 16:42   [place] LT RADIO FREQUENCY TAG LOC US GUIDE  Result Date: 04/02/2021 CLINICAL DATA:  Left breast 2 o'clock biopsy-proven invasive mammary carcinoma, post  ultrasound-guided core needle biopsy. EXAM: ULTRASOUND GUIDED RADIOFREQUENCY DEVICE LOCALIZATION OF THE LEFT BREAST COMPARISON:  Previous exam(s) FINDINGS: Patient presents  for radiofrequency device localization prior to left breast lumpectomy. I met with the patient and we discussed the procedure of radiofrequency device localization including benefits and alternatives. We discussed the high likelihood of a successful procedure. We discussed the risks of the procedure including infection, bleeding, tissue injury and further surgery. Informed, written consent was given. The usual time-out protocol was performed immediately prior to the procedure. Using sonographic guidance, sterile technique, 1% lidocaine as local anesthesia, a Savi Scout device / radiofrequency tag was used to localize left breast 2 o'clock biopsy-proven invasive mammary carcinoma using a inferior approach. The follow-up mammogram images confirm that the RF device is in the expected location and are marked for Dr. Christian Mate. Follow-up survey of the patient confirms the presence of the RF device. IMPRESSION: Radiofrequency device localization of the the left breast. No apparent complications. Electronically Signed   By: Fidela Salisbury M.D.   On: 04/02/2021 16:38      ASSESSMENT & PLAN:  1. Malignant neoplasm of upper-outer quadrant of left breast in female, estrogen receptor positive ([place])   2. Family history of breast cancer   3. Iron deficiency anemia due to chronic blood loss    #Left breast pT1c pN0 breast cancer, ER/PR+, HER2/neu negative. Pathology and images were reviewed and discussed with patient. I recommend to send Oncotype DX for prediction of chemotherapy benefit.  She is premenopausal, if no chemotherapy benefit, she will proceed with adjuvant radiation followed by antiestrogen therapy.  #Family history of breast cancer, I have referred patient to establish care with genetic counselor.  #Iron deficiency anemia,  recommend patient to oral iron supplementation with Vitron C   All questions were answered. The patient knows to call the clinic with any problems questions or concerns.  cc Evern Bio H, NP    Return of visit: To be determined. Thank you for this kind referral and the opportunity to participate in the care of this patient. A copy of today's note is routed to referring provider    Earlie Server, MD, PhD Hematology Oncology [facility] Pager- [phone] 04/27/2021

## 2021-04-27 NOTE — Progress Notes (Signed)
Patient here for follow up 2 weeks post lumpectomy and reports some sensitivity/ tenderness to left nipple area.

## 2021-04-27 NOTE — Consult Note (Signed)
NEW PATIENT EVALUATION  Name: Amanda Pearson  MRN: [id number]  Date:   04/27/2021     DOB: 11-22-1981   This 40 y.o. 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  REFERRING PHYSICIAN: Danelle Berry, NP  CHIEF COMPLAINT:  Chief Complaint  Patient presents with  . Consult    DIAGNOSIS: The encounter diagnosis was Malignant neoplasm of upper-outer quadrant of left breast in female, estrogen receptor positive ([place]).   PREVIOUS INVESTIGATIONS:  Mammogram and ultrasound reviewed Clinical notes reviewed Pathology report reviewed  HPI: Patient is a 40 year old female who presented with findings of a small mass in her left breast by her PMD.  Mammogram was performed showing a mass in the palpable area of concern in the upper outer quadrant of the left breast which is also confirmed on ultrasound to be 8.9 x 0.6 x 1.1 cm lobulated hypoechoic mass.  She underwent targeted ultrasound which was positive for mucinous carcinoma.  She then underwent a wide local excision and sentinel node biopsy for a 1.4 x 0.6 x 0.6 cm overall grade 2 mucinous carcinoma.  Margins were clear 2 mm for the invasive component there was DCIS present also margins clear at 3 mm for that.  4 sentinel lymph nodes were evaluated all negative for metastatic disease.  Tumor was ER PR positive HER2/neu not overexpressed.  She has done well postoperatively.  She has been evaluated by medical oncology and Oncotype DX has been ordered.  She is seen today for radiation oncology evaluation she is doing well she specifically denies breast tenderness cough or bone pain.  PLANNED TREATMENT REGIMEN: Left whole breast radiation  PAST MEDICAL HISTORY:  has a past medical history of Anemia, Anemia, Anxiety, Cancer ([place]), and Hypertension.    PAST  SURGICAL HISTORY:  Past Surgical History:  Procedure Laterality Date  . BREAST BIOPSY Left 03/13/2021   [place] Bx, q-clip, IMC with mucinous features  . BREAST BIOPSY Right 03/13/2021   [place] Bx, x-clip, fibroadenoma  . BREAST LUMPECTOMY,RADIO FREQ LOCALIZER,AXILLARY SENTINEL LYMPH NODE BIOPSY Left 04/10/2021   Procedure: BREAST LUMPECTOMY,RADIO FREQ LOCALIZER,AXILLARY SENTINEL LYMPH NODE BIOPSY;  Surgeon: Ronny Bacon, MD;  Location: [facility] ORS;  Service: General;  Laterality: Left;  . FACIAL COSMETIC SURGERY      FAMILY HISTORY: family history includes Breast cancer (age of onset: 41) in her paternal aunt; COPD in her mother; Kidney disease in her mother; Multiple sclerosis in her father.  SOCIAL HISTORY:  reports that she has never smoked. She has never used smokeless tobacco. She reports that she does not drink alcohol and does not use drugs.  ALLERGIES: Patient has no known allergies.  MEDICATIONS:  Current Outpatient Medications  Medication Sig Dispense Refill  . Cholecalciferol (VITAMIN D-3) 125 MCG (5000 UT) TABS Take 2 tablets by mouth daily.    Marland Kitchen ibuprofen (ADVIL) 800 MG tablet Take 1 tablet (800 mg total) by mouth every 8 (eight) hours as needed. 30 tablet 0  . Iron-Vitamin C (VITRON-C) 65-125 MG TABS Take 1 tablet by mouth daily. 30 tablet 2  . losartan (COZAAR) 50 MG tablet Take 100 mg by mouth daily.     No current facility-administered medications for this encounter.    ECOG PERFORMANCE STATUS:  0 - Asymptomatic  REVIEW OF SYSTEMS: Patient denies any weight loss, fatigue, weakness,  fever, chills or night sweats. Patient denies any loss of vision, blurred vision. Patient denies any ringing  of the ears or hearing loss. No irregular heartbeat. Patient denies heart murmur or history of fainting. Patient denies any chest pain or pain radiating to her upper extremities. Patient denies any shortness of breath, difficulty breathing at night, cough or hemoptysis. Patient denies any  swelling in the lower legs. Patient denies any nausea vomiting, vomiting of blood, or coffee ground material in the vomitus. Patient denies any stomach pain. Patient states has had normal bowel movements no significant constipation or diarrhea. Patient denies any dysuria, hematuria or significant nocturia. Patient denies any problems walking, swelling in the joints or loss of balance. Patient denies any skin changes, loss of hair or loss of weight. Patient denies any excessive worrying or anxiety or significant depression. Patient denies any problems with insomnia. Patient denies excessive thirst, polyuria, polydipsia. Patient denies any swollen glands, patient denies easy bruising or easy bleeding. Patient denies any recent infections, allergies or URI. Patient "s visual fields have not changed significantly in recent time.   PHYSICAL EXAM: BP (!) 163/95   Pulse 96   Temp 98.6 F (37 C)   Resp 20   Wt 244 lb (110.7 kg)   SpO2 96%   BMI 43.22 kg/m  Patient has large breasts.  She is status post wide local excision of the left breast and sentinel node biopsy both incisions healing well no dominant masses noted in either breast in 2 positions examined.  No axillary or supraclavicular adenopathy is identified.  Well-developed well-nourished patient in NAD. HEENT reveals PERLA, EOMI, discs not visualized.  Oral cavity is clear. No oral mucosal lesions are identified. Neck is clear without evidence of cervical or supraclavicular adenopathy. Lungs are clear to A&P. Cardiac examination is essentially unremarkable with regular rate and rhythm without murmur rub or thrill. Abdomen is benign with no organomegaly or masses noted. Motor sensory and DTR levels are equal and symmetric in the upper and lower extremities. Cranial nerves II through XII are grossly intact. Proprioception is intact. No peripheral adenopathy or edema is identified. No motor or sensory levels are noted. Crude visual fields are within normal  range.  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  Should she need chemotherapy we will sequence our radiation after completion of that.  I have gone over the risks and benefits of radiation therapy based on her large breast size would offer 5040 cGy in 28 fractions boosting her scar another 1000 cGy in 5 fractions using electron beam risks and benefits of treatment including possible inclusion of superficial lung fatigue alteration of blood counts skin reaction all were described in detail.  Patient comprehends my recommendations well.  I have personally set up and ordered CT simulation in about 2 weeks to allow Oncotype DX results prior to beginning treatment.  Patient and husband both seem to comprehend my treatment plan well.  I would like to take this opportunity to thank you for allowing me to participate in the care of your patient.Noreene Filbert, MD

## 2021-04-27 NOTE — Telephone Encounter (Signed)
Oncotype DX testing request subitted online on breast specimen : [id number] collected on 04/10/21. Demographic sheet, copy of ins card and copy of path report faxed to [facility]  @ [phone].   Order: [id number]

## 2021-05-07 ENCOUNTER — Ambulatory Visit: Payer: BC Managed Care – PPO

## 2021-05-08 ENCOUNTER — Encounter: Payer: Self-pay | Admitting: Oncology

## 2021-05-08 NOTE — Telephone Encounter (Signed)
Oncotype Dx results scanned in media.

## 2021-05-14 ENCOUNTER — Encounter: Payer: Self-pay | Admitting: Oncology

## 2021-05-15 NOTE — Telephone Encounter (Signed)
pt scheduled to start radiation on 6/13. Will add her to reminder list and get her scheduled to see MD after final XRT.

## 2021-05-16 ENCOUNTER — Encounter: Payer: Self-pay | Admitting: *Deleted

## 2021-05-16 NOTE — Progress Notes (Signed)
Called  Patient and informed her of her Low risk Oncotype score of 8.  Reviewed  No chemotherapy recommended.  To move forward with radiation therapy, and to follow up with Dr. Tasia Catchings 2 weeks after she finishes radiation.

## 2021-05-18 ENCOUNTER — Ambulatory Visit: Payer: BC Managed Care – PPO

## 2021-05-18 DIAGNOSIS — C50412 Malignant neoplasm of upper-outer quadrant of left female breast: Secondary | ICD-10-CM | POA: Diagnosis present

## 2021-05-18 DIAGNOSIS — Z510 Encounter for antineoplastic radiation therapy: Secondary | ICD-10-CM | POA: Diagnosis not present

## 2021-05-20 DIAGNOSIS — Z510 Encounter for antineoplastic radiation therapy: Secondary | ICD-10-CM | POA: Diagnosis not present

## 2021-05-22 ENCOUNTER — Other Ambulatory Visit: Payer: Self-pay | Admitting: *Deleted

## 2021-05-22 DIAGNOSIS — Z170 Estrogen receptor positive status [ER+]: Secondary | ICD-10-CM

## 2021-05-25 ENCOUNTER — Ambulatory Visit: Admission: RE | Admit: 2021-05-25 | Payer: BC Managed Care – PPO | Source: Ambulatory Visit

## 2021-05-25 DIAGNOSIS — Z510 Encounter for antineoplastic radiation therapy: Secondary | ICD-10-CM | POA: Diagnosis not present

## 2021-05-26 ENCOUNTER — Ambulatory Visit
Admission: RE | Admit: 2021-05-26 | Discharge: 2021-05-26 | Disposition: A | Discharge status: Home / Self Care | Payer: BC Managed Care – PPO | Source: Ambulatory Visit | Attending: Radiation Oncology | Admitting: Radiation Oncology

## 2021-05-26 DIAGNOSIS — Z510 Encounter for antineoplastic radiation therapy: Secondary | ICD-10-CM | POA: Diagnosis not present

## 2021-05-27 ENCOUNTER — Ambulatory Visit
Admission: RE | Admit: 2021-05-27 | Discharge: 2021-05-27 | Disposition: A | Discharge status: Home / Self Care | Payer: BC Managed Care – PPO | Source: Ambulatory Visit | Attending: Radiation Oncology | Admitting: Radiation Oncology

## 2021-05-27 DIAGNOSIS — Z510 Encounter for antineoplastic radiation therapy: Secondary | ICD-10-CM | POA: Diagnosis not present

## 2021-05-28 ENCOUNTER — Ambulatory Visit
Admission: RE | Admit: 2021-05-28 | Discharge: 2021-05-28 | Disposition: A | Discharge status: Home / Self Care | Payer: BC Managed Care – PPO | Source: Ambulatory Visit | Attending: Radiation Oncology | Admitting: Radiation Oncology

## 2021-05-28 DIAGNOSIS — Z510 Encounter for antineoplastic radiation therapy: Secondary | ICD-10-CM | POA: Diagnosis not present

## 2021-05-29 ENCOUNTER — Ambulatory Visit
Admission: RE | Admit: 2021-05-29 | Discharge: 2021-05-29 | Disposition: A | Discharge status: Home / Self Care | Payer: BC Managed Care – PPO | Source: Ambulatory Visit | Attending: Radiation Oncology | Admitting: Radiation Oncology

## 2021-05-29 DIAGNOSIS — Z510 Encounter for antineoplastic radiation therapy: Secondary | ICD-10-CM | POA: Diagnosis not present

## 2021-06-01 ENCOUNTER — Ambulatory Visit
Admission: RE | Admit: 2021-06-01 | Discharge: 2021-06-01 | Disposition: A | Discharge status: Home / Self Care | Payer: BC Managed Care – PPO | Source: Ambulatory Visit | Attending: Radiation Oncology | Admitting: Radiation Oncology

## 2021-06-01 DIAGNOSIS — Z510 Encounter for antineoplastic radiation therapy: Secondary | ICD-10-CM | POA: Diagnosis not present

## 2021-06-02 ENCOUNTER — Ambulatory Visit
Admission: RE | Admit: 2021-06-02 | Discharge: 2021-06-02 | Disposition: A | Discharge status: Home / Self Care | Payer: BC Managed Care – PPO | Source: Ambulatory Visit | Attending: Radiation Oncology | Admitting: Radiation Oncology

## 2021-06-02 DIAGNOSIS — Z510 Encounter for antineoplastic radiation therapy: Secondary | ICD-10-CM | POA: Diagnosis not present

## 2021-06-03 ENCOUNTER — Ambulatory Visit
Admission: RE | Admit: 2021-06-03 | Discharge: 2021-06-03 | Disposition: A | Discharge status: Home / Self Care | Payer: BC Managed Care – PPO | Source: Ambulatory Visit | Attending: Radiation Oncology | Admitting: Radiation Oncology

## 2021-06-03 DIAGNOSIS — Z510 Encounter for antineoplastic radiation therapy: Secondary | ICD-10-CM | POA: Diagnosis not present

## 2021-06-04 ENCOUNTER — Ambulatory Visit
Admission: RE | Admit: 2021-06-04 | Discharge: 2021-06-04 | Disposition: A | Discharge status: Home / Self Care | Payer: BC Managed Care – PPO | Source: Ambulatory Visit | Attending: Radiation Oncology | Admitting: Radiation Oncology

## 2021-06-04 DIAGNOSIS — Z510 Encounter for antineoplastic radiation therapy: Secondary | ICD-10-CM | POA: Diagnosis not present

## 2021-06-05 ENCOUNTER — Ambulatory Visit
Admission: RE | Admit: 2021-06-05 | Discharge: 2021-06-05 | Disposition: A | Discharge status: Home / Self Care | Payer: BC Managed Care – PPO | Source: Ambulatory Visit | Attending: Radiation Oncology | Admitting: Radiation Oncology

## 2021-06-05 DIAGNOSIS — Z510 Encounter for antineoplastic radiation therapy: Secondary | ICD-10-CM | POA: Diagnosis not present

## 2021-06-08 ENCOUNTER — Ambulatory Visit
Admission: RE | Admit: 2021-06-08 | Discharge: 2021-06-08 | Disposition: A | Discharge status: Home / Self Care | Payer: BC Managed Care – PPO | Source: Ambulatory Visit | Attending: Radiation Oncology | Admitting: Radiation Oncology

## 2021-06-08 DIAGNOSIS — Z510 Encounter for antineoplastic radiation therapy: Secondary | ICD-10-CM | POA: Diagnosis not present

## 2021-06-09 ENCOUNTER — Ambulatory Visit
Admission: RE | Admit: 2021-06-09 | Discharge: 2021-06-09 | Disposition: A | Discharge status: Home / Self Care | Payer: BC Managed Care – PPO | Source: Ambulatory Visit | Attending: Radiation Oncology | Admitting: Radiation Oncology

## 2021-06-09 DIAGNOSIS — Z510 Encounter for antineoplastic radiation therapy: Secondary | ICD-10-CM | POA: Diagnosis not present

## 2021-06-10 ENCOUNTER — Inpatient Hospital Stay: Payer: BC Managed Care – PPO | Attending: Oncology

## 2021-06-10 ENCOUNTER — Ambulatory Visit
Admission: RE | Admit: 2021-06-10 | Discharge: 2021-06-10 | Disposition: A | Discharge status: Home / Self Care | Payer: BC Managed Care – PPO | Source: Ambulatory Visit | Attending: Radiation Oncology | Admitting: Radiation Oncology

## 2021-06-10 DIAGNOSIS — C50412 Malignant neoplasm of upper-outer quadrant of left female breast: Secondary | ICD-10-CM | POA: Diagnosis present

## 2021-06-10 DIAGNOSIS — Z170 Estrogen receptor positive status [ER+]: Secondary | ICD-10-CM

## 2021-06-10 DIAGNOSIS — Z510 Encounter for antineoplastic radiation therapy: Secondary | ICD-10-CM | POA: Diagnosis not present

## 2021-06-10 LAB — CBC
HCT: 34 % — ABNORMAL LOW (ref 36.0–46.0)
Hemoglobin: 10 g/dL — ABNORMAL LOW (ref 12.0–15.0)
MCH: 22.6 pg — ABNORMAL LOW (ref 26.0–34.0)
MCHC: 29.4 g/dL — ABNORMAL LOW (ref 30.0–36.0)
MCV: 76.7 fL — ABNORMAL LOW (ref 80.0–100.0)
Platelets: 424 10*3/uL — ABNORMAL HIGH (ref 150–400)
RBC: 4.43 MIL/uL (ref 3.87–5.11)
RDW: 18.4 % — ABNORMAL HIGH (ref 11.5–15.5)
WBC: 4.3 10*3/uL (ref 4.0–10.5)
nRBC: 0 % (ref 0.0–0.2)

## 2021-06-11 ENCOUNTER — Ambulatory Visit
Admission: RE | Admit: 2021-06-11 | Discharge: 2021-06-11 | Disposition: A | Discharge status: Home / Self Care | Payer: BC Managed Care – PPO | Source: Ambulatory Visit | Attending: Radiation Oncology | Admitting: Radiation Oncology

## 2021-06-11 DIAGNOSIS — Z510 Encounter for antineoplastic radiation therapy: Secondary | ICD-10-CM | POA: Diagnosis not present

## 2021-06-12 ENCOUNTER — Ambulatory Visit
Admission: RE | Admit: 2021-06-12 | Discharge: 2021-06-12 | Disposition: A | Discharge status: Home / Self Care | Payer: BC Managed Care – PPO | Source: Ambulatory Visit | Attending: Radiation Oncology | Admitting: Radiation Oncology

## 2021-06-12 DIAGNOSIS — C50412 Malignant neoplasm of upper-outer quadrant of left female breast: Secondary | ICD-10-CM | POA: Diagnosis present

## 2021-06-12 DIAGNOSIS — Z510 Encounter for antineoplastic radiation therapy: Secondary | ICD-10-CM | POA: Insufficient documentation

## 2021-06-16 ENCOUNTER — Ambulatory Visit
Admission: RE | Admit: 2021-06-16 | Discharge: 2021-06-16 | Disposition: A | Discharge status: Home / Self Care | Payer: BC Managed Care – PPO | Source: Ambulatory Visit | Attending: Radiation Oncology | Admitting: Radiation Oncology

## 2021-06-16 DIAGNOSIS — Z510 Encounter for antineoplastic radiation therapy: Secondary | ICD-10-CM | POA: Diagnosis not present

## 2021-06-17 ENCOUNTER — Ambulatory Visit
Admission: RE | Admit: 2021-06-17 | Discharge: 2021-06-17 | Disposition: A | Discharge status: Home / Self Care | Payer: BC Managed Care – PPO | Source: Ambulatory Visit | Attending: Radiation Oncology | Admitting: Radiation Oncology

## 2021-06-17 DIAGNOSIS — Z510 Encounter for antineoplastic radiation therapy: Secondary | ICD-10-CM | POA: Diagnosis not present

## 2021-06-18 ENCOUNTER — Ambulatory Visit
Admission: RE | Admit: 2021-06-18 | Discharge: 2021-06-18 | Disposition: A | Discharge status: Home / Self Care | Payer: BC Managed Care – PPO | Source: Ambulatory Visit | Attending: Radiation Oncology | Admitting: Radiation Oncology

## 2021-06-18 DIAGNOSIS — Z510 Encounter for antineoplastic radiation therapy: Secondary | ICD-10-CM | POA: Diagnosis not present

## 2021-06-19 ENCOUNTER — Ambulatory Visit
Admission: RE | Admit: 2021-06-19 | Discharge: 2021-06-19 | Disposition: A | Discharge status: Home / Self Care | Payer: BC Managed Care – PPO | Source: Ambulatory Visit | Attending: Radiation Oncology | Admitting: Radiation Oncology

## 2021-06-19 DIAGNOSIS — Z510 Encounter for antineoplastic radiation therapy: Secondary | ICD-10-CM | POA: Diagnosis not present

## 2021-06-22 ENCOUNTER — Telehealth: Payer: Self-pay

## 2021-06-22 ENCOUNTER — Ambulatory Visit
Admission: RE | Admit: 2021-06-22 | Discharge: 2021-06-22 | Disposition: A | Discharge status: Home / Self Care | Payer: BC Managed Care – PPO | Source: Ambulatory Visit | Attending: Radiation Oncology | Admitting: Radiation Oncology

## 2021-06-22 DIAGNOSIS — Z510 Encounter for antineoplastic radiation therapy: Secondary | ICD-10-CM | POA: Diagnosis not present

## 2021-06-22 NOTE — Telephone Encounter (Signed)
Final radiation tx scheduled for 7/29. Please schedule patient for MD only follow up 2 weeks after final xrt. Please notify pt of appt. thanks

## 2021-06-23 ENCOUNTER — Ambulatory Visit
Admission: RE | Admit: 2021-06-23 | Discharge: 2021-06-23 | Disposition: A | Discharge status: Home / Self Care | Payer: BC Managed Care – PPO | Source: Ambulatory Visit | Attending: Radiation Oncology | Admitting: Radiation Oncology

## 2021-06-23 DIAGNOSIS — Z510 Encounter for antineoplastic radiation therapy: Secondary | ICD-10-CM | POA: Diagnosis not present

## 2021-06-24 ENCOUNTER — Inpatient Hospital Stay: Payer: BC Managed Care – PPO | Attending: Oncology

## 2021-06-24 ENCOUNTER — Ambulatory Visit
Admission: RE | Admit: 2021-06-24 | Discharge: 2021-06-24 | Disposition: A | Discharge status: Home / Self Care | Payer: BC Managed Care – PPO | Source: Ambulatory Visit | Attending: Radiation Oncology | Admitting: Radiation Oncology

## 2021-06-24 ENCOUNTER — Other Ambulatory Visit: Payer: Self-pay

## 2021-06-24 DIAGNOSIS — Z170 Estrogen receptor positive status [ER+]: Secondary | ICD-10-CM

## 2021-06-24 DIAGNOSIS — C50412 Malignant neoplasm of upper-outer quadrant of left female breast: Secondary | ICD-10-CM | POA: Diagnosis present

## 2021-06-24 DIAGNOSIS — Z510 Encounter for antineoplastic radiation therapy: Secondary | ICD-10-CM | POA: Diagnosis not present

## 2021-06-24 LAB — CBC
HCT: 31.7 % — ABNORMAL LOW (ref 36.0–46.0)
Hemoglobin: 9.8 g/dL — ABNORMAL LOW (ref 12.0–15.0)
MCH: 23.8 pg — ABNORMAL LOW (ref 26.0–34.0)
MCHC: 30.9 g/dL (ref 30.0–36.0)
MCV: 76.9 fL — ABNORMAL LOW (ref 80.0–100.0)
Platelets: 326 10*3/uL (ref 150–400)
RBC: 4.12 MIL/uL (ref 3.87–5.11)
RDW: 18 % — ABNORMAL HIGH (ref 11.5–15.5)
WBC: 5.3 10*3/uL (ref 4.0–10.5)
nRBC: 0 % (ref 0.0–0.2)

## 2021-06-25 ENCOUNTER — Ambulatory Visit
Admission: RE | Admit: 2021-06-25 | Discharge: 2021-06-25 | Disposition: A | Discharge status: Home / Self Care | Payer: BC Managed Care – PPO | Source: Ambulatory Visit | Attending: Radiation Oncology | Admitting: Radiation Oncology

## 2021-06-25 DIAGNOSIS — Z510 Encounter for antineoplastic radiation therapy: Secondary | ICD-10-CM | POA: Diagnosis not present

## 2021-06-26 ENCOUNTER — Ambulatory Visit
Admission: RE | Admit: 2021-06-26 | Discharge: 2021-06-26 | Disposition: A | Discharge status: Home / Self Care | Payer: BC Managed Care – PPO | Source: Ambulatory Visit | Attending: Radiation Oncology | Admitting: Radiation Oncology

## 2021-06-26 DIAGNOSIS — Z510 Encounter for antineoplastic radiation therapy: Secondary | ICD-10-CM | POA: Diagnosis not present

## 2021-06-29 ENCOUNTER — Ambulatory Visit
Admission: RE | Admit: 2021-06-29 | Discharge: 2021-06-29 | Disposition: A | Discharge status: Home / Self Care | Payer: BC Managed Care – PPO | Source: Ambulatory Visit | Attending: Radiation Oncology | Admitting: Radiation Oncology

## 2021-06-29 DIAGNOSIS — Z510 Encounter for antineoplastic radiation therapy: Secondary | ICD-10-CM | POA: Diagnosis not present

## 2021-06-30 ENCOUNTER — Ambulatory Visit
Admission: RE | Admit: 2021-06-30 | Discharge: 2021-06-30 | Disposition: A | Discharge status: Home / Self Care | Payer: BC Managed Care – PPO | Source: Ambulatory Visit | Attending: Radiation Oncology | Admitting: Radiation Oncology

## 2021-06-30 DIAGNOSIS — Z510 Encounter for antineoplastic radiation therapy: Secondary | ICD-10-CM | POA: Diagnosis not present

## 2021-07-01 ENCOUNTER — Ambulatory Visit
Admission: RE | Admit: 2021-07-01 | Discharge: 2021-07-01 | Disposition: A | Discharge status: Home / Self Care | Payer: BC Managed Care – PPO | Source: Ambulatory Visit | Attending: Radiation Oncology | Admitting: Radiation Oncology

## 2021-07-01 DIAGNOSIS — Z510 Encounter for antineoplastic radiation therapy: Secondary | ICD-10-CM | POA: Diagnosis not present

## 2021-07-02 ENCOUNTER — Ambulatory Visit
Admission: RE | Admit: 2021-07-02 | Discharge: 2021-07-02 | Disposition: A | Discharge status: Home / Self Care | Payer: BC Managed Care – PPO | Source: Ambulatory Visit | Attending: Radiation Oncology | Admitting: Radiation Oncology

## 2021-07-02 DIAGNOSIS — Z510 Encounter for antineoplastic radiation therapy: Secondary | ICD-10-CM | POA: Diagnosis not present

## 2021-07-03 ENCOUNTER — Ambulatory Visit
Admission: RE | Admit: 2021-07-03 | Discharge: 2021-07-03 | Disposition: A | Discharge status: Home / Self Care | Payer: BC Managed Care – PPO | Source: Ambulatory Visit | Attending: Radiation Oncology | Admitting: Radiation Oncology

## 2021-07-03 DIAGNOSIS — Z510 Encounter for antineoplastic radiation therapy: Secondary | ICD-10-CM | POA: Diagnosis not present

## 2021-07-06 ENCOUNTER — Ambulatory Visit
Admission: RE | Admit: 2021-07-06 | Discharge: 2021-07-06 | Disposition: A | Discharge status: Home / Self Care | Payer: BC Managed Care – PPO | Source: Ambulatory Visit | Attending: Radiation Oncology | Admitting: Radiation Oncology

## 2021-07-06 DIAGNOSIS — Z510 Encounter for antineoplastic radiation therapy: Secondary | ICD-10-CM | POA: Diagnosis not present

## 2021-07-07 ENCOUNTER — Encounter: Payer: Self-pay | Admitting: Oncology

## 2021-07-07 ENCOUNTER — Ambulatory Visit
Admission: RE | Admit: 2021-07-07 | Discharge: 2021-07-07 | Disposition: A | Discharge status: Home / Self Care | Payer: BC Managed Care – PPO | Source: Ambulatory Visit | Attending: Radiation Oncology | Admitting: Radiation Oncology

## 2021-07-07 DIAGNOSIS — Z510 Encounter for antineoplastic radiation therapy: Secondary | ICD-10-CM | POA: Diagnosis not present

## 2021-07-08 ENCOUNTER — Ambulatory Visit
Admission: RE | Admit: 2021-07-08 | Discharge: 2021-07-08 | Disposition: A | Discharge status: Home / Self Care | Payer: BC Managed Care – PPO | Source: Ambulatory Visit | Attending: Radiation Oncology | Admitting: Radiation Oncology

## 2021-07-08 DIAGNOSIS — Z510 Encounter for antineoplastic radiation therapy: Secondary | ICD-10-CM | POA: Diagnosis not present

## 2021-07-09 ENCOUNTER — Ambulatory Visit
Admission: RE | Admit: 2021-07-09 | Discharge: 2021-07-09 | Disposition: A | Discharge status: Home / Self Care | Payer: BC Managed Care – PPO | Source: Ambulatory Visit | Attending: Radiation Oncology | Admitting: Radiation Oncology

## 2021-07-09 DIAGNOSIS — Z510 Encounter for antineoplastic radiation therapy: Secondary | ICD-10-CM | POA: Diagnosis not present

## 2021-07-10 ENCOUNTER — Ambulatory Visit
Admission: RE | Admit: 2021-07-10 | Discharge: 2021-07-10 | Disposition: A | Discharge status: Home / Self Care | Payer: BC Managed Care – PPO | Source: Ambulatory Visit | Attending: Radiation Oncology | Admitting: Radiation Oncology

## 2021-07-10 DIAGNOSIS — Z510 Encounter for antineoplastic radiation therapy: Secondary | ICD-10-CM | POA: Diagnosis not present

## 2021-07-24 ENCOUNTER — Inpatient Hospital Stay: Payer: BC Managed Care – PPO | Attending: Oncology | Admitting: Oncology

## 2021-07-24 ENCOUNTER — Encounter: Payer: Self-pay | Admitting: Oncology

## 2021-07-24 ENCOUNTER — Other Ambulatory Visit: Payer: Self-pay

## 2021-07-24 VITALS — BP 137/97 | HR 90 | Temp 99.1°F | Resp 18 | Wt 242.1 lb

## 2021-07-24 DIAGNOSIS — D500 Iron deficiency anemia secondary to blood loss (chronic): Secondary | ICD-10-CM

## 2021-07-24 DIAGNOSIS — C50412 Malignant neoplasm of upper-outer quadrant of left female breast: Secondary | ICD-10-CM | POA: Diagnosis not present

## 2021-07-24 DIAGNOSIS — D509 Iron deficiency anemia, unspecified: Secondary | ICD-10-CM | POA: Diagnosis not present

## 2021-07-24 DIAGNOSIS — Z803 Family history of malignant neoplasm of breast: Secondary | ICD-10-CM | POA: Diagnosis not present

## 2021-07-24 DIAGNOSIS — L598 Other specified disorders of the skin and subcutaneous tissue related to radiation: Secondary | ICD-10-CM | POA: Insufficient documentation

## 2021-07-24 DIAGNOSIS — Z170 Estrogen receptor positive status [ER+]: Secondary | ICD-10-CM

## 2021-07-24 MED ORDER — VITRON-C 65-125 MG PO TABS: 1.0000 | ORAL_TABLET | Freq: Every day | ORAL | 2 refills | Status: DC

## 2021-07-24 MED ORDER — TAMOXIFEN CITRATE 20 MG PO TABS: 20.0000 mg | ORAL_TABLET | Freq: Every day | ORAL | 1 refills | Status: DC

## 2021-07-25 NOTE — Progress Notes (Signed)
Hematology/Oncology Consult note [facility] Telephone:([phone] [phone]   Patient Care Team: Danelle Berry, NP as PCP - General (Nurse Practitioner) Rico Junker, RN as Oncology Nurse Navigator  REFERRING PROVIDER: Danelle Berry, NP  CHIEF COMPLAINTS/REASON FOR VISIT:   Follow up for left breast cancer.   HISTORY OF PRESENTING ILLNESS:   Amanda Pearson is a  40 y.o.  female with PMH listed below was seen in consultation at the request of  Danelle Berry, NP  for evaluation of left breast cancer Patient was seen recently by gynecology for physical. There was a mass palpated in the left breast.  Patient was recommended to proceed with diagnostic mammogram 03/09/2021, bilateral diagnostic mammogram and ultrasound showed Left breast 0.9 x 0.6 x 1.1 cm slight lobulated hypoechoic mass at 2:00 11 cm from the nipple.  Ultrasound of the left axillary is negative Right breast 1 x 0.5x 0.7 cm 9:00 15 cm from nipple Patient was recommended for 17-monthfollow-up for needle biopsy.  Patient opted   have biopsy of both mass.  03/13/2021, patient went biopsy of both breast masses. 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  Grade 2 ER/PR/HER-2 status is pending.  Patient was referred to establish care with oncology.  She also has an appointment with Dr. RLutricia Feilnext week Patient was accompanied by her husband. She is very anxious and feels overwhelmed after receiving the news. Denies any breast pain, nipple discharge. Menarche  -89to 40years old She has no children. History of OCP use for about a year.  no hormone replacement therapy  LMP March 2022 Denies any previous biopsies Family history positive for paternal aunt was diagnosed with cancer in late 453s-early 531s                    # 04/10/2021, left breast lumpectomy pathology showed invasive  mucinous carcinoma, DCIS, 4 left axillary lymph node was excised and 0 positive for pregnancy. pT1c pN0                                                        INTERVAL HISTORY Amanda Buffalois a 40y.o. female who has above history reviewed by me today presents for follow up visit for management of stage I ER/PR positive, HER2 negative left breast cancer. 05/26/2021- 07/03/2021 Adjuvant breast radiation.  Patient has developed radiation dermatitis and still has some breast tenderness. 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    Review of Systems  Constitutional:  Negative for appetite change, chills, fatigue and fever.  HENT:   Negative for hearing loss and voice change.   Eyes:  Negative for eye problems.  Respiratory:  Negative for chest tightness and cough.   Cardiovascular:  Negative for chest pain.  Gastrointestinal:  Negative for abdominal distention, abdominal pain and blood in stool.  Endocrine: Negative for hot flashes.  Genitourinary:  Negative for difficulty urinating and frequency.   Musculoskeletal:  Negative for arthralgias.  Skin:  Negative for itching and rash.  Neurological:  Negative for extremity weakness.  Hematological:  Negative for adenopathy.  Psychiatric/Behavioral:  Negative for confusion. The patient is not nervous/anxious.   Left breast and axillary soreness.   MEDICAL  HISTORY:  Past Medical History:  Diagnosis Date   Anemia    Anemia    Anxiety    Cancer ([place])    Hypertension     SURGICAL HISTORY: Past Surgical History:  Procedure Laterality Date   BREAST BIOPSY Left 03/13/2021   [place] Bx, q-clip, IMC with mucinous features   BREAST BIOPSY Right 03/13/2021   [place] Bx, x-clip, fibroadenoma   BREAST LUMPECTOMY,RADIO FREQ LOCALIZER,AXILLARY SENTINEL LYMPH NODE BIOPSY Left 04/10/2021   Procedure: BREAST LUMPECTOMY,RADIO FREQ LOCALIZER,AXILLARY SENTINEL LYMPH NODE BIOPSY;  Surgeon: Ronny Bacon, MD;  Location: [facility] ORS;  Service: General;   Laterality: Left;   FACIAL COSMETIC SURGERY      SOCIAL HISTORY: Social History   Socioeconomic History   Marital status: Married    Spouse name: Fredderick   Number of children: Not on file   Years of education: Not on file   Highest education level: Not on file  Occupational History   Not on file  Tobacco Use   Smoking status: Never   Smokeless tobacco: Never  Vaping Use   Vaping Use: Never used  Substance and Sexual Activity   Alcohol use: No   Drug use: No   Sexual activity: Yes  Other Topics Concern   Not on file  Social History Narrative   Not on file   Social Determinants of Health   Financial Resource Strain: Not on file  Food Insecurity: Not on file  Transportation Needs: Not on file  Physical Activity: Not on file  Stress: Not on file  Social Connections: Not on file  Intimate Partner Violence: Not on file    FAMILY HISTORY: Family History  Problem Relation Age of Onset   COPD Mother    Kidney disease Mother    Multiple sclerosis Father    Breast cancer Paternal Aunt 31    ALLERGIES:  has No Known Allergies.  MEDICATIONS:  Current Outpatient Medications  Medication Sig Dispense Refill   amLODipine (NORVASC) 2.5 MG tablet Take 2.5 mg by mouth every morning.     Cholecalciferol (VITAMIN D-3) 125 MCG (5000 UT) TABS Take 2 tablets by mouth daily.     ibuprofen (ADVIL) 800 MG tablet Take 1 tablet (800 mg total) by mouth every 8 (eight) hours as needed. 30 tablet 0   losartan (COZAAR) 50 MG tablet Take 100 mg by mouth daily.     omeprazole (PRILOSEC) 40 MG capsule Take by mouth.     rosuvastatin (CRESTOR) 20 MG tablet Take 20 mg by mouth at bedtime.     tamoxifen (NOLVADEX) 20 MG tablet Take 1 tablet (20 mg total) by mouth daily. 30 tablet 1   Iron-Vitamin C (VITRON-C) 65-125 MG TABS Take 1 tablet by mouth daily. 30 tablet 2   No current facility-administered medications for this visit.     PHYSICAL EXAMINATION: ECOG PERFORMANCE STATUS: 0 -  Asymptomatic Vitals:   07/24/21 1212  BP: (!) 137/97  Pulse: 90  Resp: 18  Temp: 99.1 F (37.3 C)  SpO2: 100%   Filed Weights   07/24/21 1212  Weight: 242 lb 1.6 oz (109.8 kg)    Physical Exam Constitutional:      General: She is not in acute distress. HENT:     Head: Normocephalic and atraumatic.  Eyes:     General: No scleral icterus. Cardiovascular:     Rate and Rhythm: Normal rate and regular rhythm.     Heart sounds: Normal heart sounds.  Pulmonary:     Effort:  Pulmonary effort is normal. No respiratory distress.     Breath sounds: No wheezing.  Abdominal:     General: Bowel sounds are normal. There is no distension.     Palpations: Abdomen is soft.  Musculoskeletal:        General: No deformity. Normal range of motion.     Cervical back: Normal range of motion and neck supple.  Skin:    General: Skin is warm and dry.     Findings: No erythema or rash.  Neurological:     Mental Status: She is alert and oriented to person, place, and time. Mental status is at baseline.     Cranial Nerves: No cranial nerve deficit.     Coordination: Coordination normal.  Psychiatric:        Mood and Affect: Mood normal.    LABORATORY DATA:  I have reviewed the data as listed Lab Results  Component Value Date   WBC 5.3 06/24/2021   HGB 9.8 (L) 06/24/2021   HCT 31.7 (L) 06/24/2021   MCV 76.9 (L) 06/24/2021   PLT 326 06/24/2021   Recent Labs    02/09/21 0844 03/17/21 1519  NA 137 135  K 4.6 3.6  CL 105 105  CO2 20 21*  GLUCOSE 97 98  BUN 5* 13  CREATININE 0.69 0.63  CALCIUM 9.1 8.8*  GFRNONAA  --  >60  PROT  --  8.6*  ALBUMIN  --  4.1  AST  --  15  ALT  --  12  ALKPHOS  --  43  BILITOT  --  0.7    Iron/TIBC/Ferritin/ %Sat    Component Value Date/Time   IRON 31 03/17/2021 1519   TIBC 487 (H) 03/17/2021 1519   FERRITIN 16 03/17/2021 1519   IRONPCTSAT 6 (L) 03/17/2021 1519      RADIOGRAPHIC STUDIES: I have personally reviewed the radiological images  as listed and agreed with the findings in the report.      ASSESSMENT & PLAN:  1. Malignant neoplasm of upper-outer quadrant of left breast in female, estrogen receptor positive ([place])   2. Iron deficiency anemia due to chronic blood loss   3. Family history of breast cancer    #Left breast pT1c pN0 breast cancer, ER/PR+, HER2/neu negative - 04/10/21  S/p left lumpectomy/SLNB- adjuvant RT Oncotype Dx 8.  Recommend adjuvant estrogen therapy with Tamoxifen - premenopausal.  Side effects of tamoxifen including but not limited to hot flush,  fatigue, retinopathy, thrombosis, uterus endometrial cancer discussed with patient. Patient voices understanding and is willing to proceed with treatment. Recommend annual gyn follow up for pelvic exam Rx sent to pharmacy.    # Radiation dermatitis. Anticipate recovery in the next few weeks. Follow up with Radonc.  Light duty letter for 4 weeks.  # Family history of breast cancer, I have referred patient to establish care with genetic counselor.  #Iron deficiency anemia,  continue oral iron supplementation with Vitron C- refilled. Check iron panel at the next visit.    All questions were answered. The patient knows to call the clinic with any problems questions or concerns.  cc Evern Bio H, NP    Return of visit:  7 weeks.   Earlie Server, MD, PhD Hematology Oncology [facility] Pager- [phone] 07/25/2021

## 2021-07-25 NOTE — Addendum Note (Signed)
Addended by: Earlie Server on: 07/25/2021 09:10 PM   Modules accepted: Level of Service

## 2021-08-14 ENCOUNTER — Other Ambulatory Visit: Payer: Self-pay

## 2021-08-14 ENCOUNTER — Encounter: Payer: Self-pay | Admitting: Radiation Oncology

## 2021-08-14 ENCOUNTER — Encounter: Payer: Self-pay | Admitting: *Deleted

## 2021-08-14 ENCOUNTER — Ambulatory Visit
Admission: RE | Admit: 2021-08-14 | Discharge: 2021-08-14 | Disposition: A | Discharge status: Home / Self Care | Payer: BC Managed Care – PPO | Source: Ambulatory Visit | Attending: Radiation Oncology | Admitting: Radiation Oncology

## 2021-08-14 DIAGNOSIS — Z79810 Long term (current) use of selective estrogen receptor modulators (SERMs): Secondary | ICD-10-CM | POA: Diagnosis not present

## 2021-08-14 DIAGNOSIS — Z170 Estrogen receptor positive status [ER+]: Secondary | ICD-10-CM | POA: Insufficient documentation

## 2021-08-14 DIAGNOSIS — C50412 Malignant neoplasm of upper-outer quadrant of left female breast: Secondary | ICD-10-CM | POA: Insufficient documentation

## 2021-08-14 DIAGNOSIS — Z923 Personal history of irradiation: Secondary | ICD-10-CM | POA: Insufficient documentation

## 2021-08-14 NOTE — Progress Notes (Signed)
Radiation Oncology Follow up Note  Name: Amanda Pearson   Date:   08/14/2021 MRN:  [id number] DOB: 12/02/1981    This 40 y.o. female presents to the clinic today for 1 month follow-up status post whole breast radiation to her left breast for stage Ia (T1 cN0 M0) ER/PR positive invasive mammary mammary carcinoma with mucinous features as well as DCIS status post wide local excision and sentinel node biopsy.  REFERRING PROVIDER: Danelle Berry, NP  HPI: Patient is a 40 year old female now at 1 month having completed course of whole breast radiation.  To her left breast for stage Ia ER/PR positive invasive mammary carcinoma.  Seen today in routine follow-up she is doing well.  She still somewhat tender in her left breast and still slightly hyperpigmented otherwise doing extremely well.  She has been started on tamoxifen is tolerating it well without side effect.  COMPLICATIONS OF TREATMENT: none  FOLLOW UP COMPLIANCE: keeps appointments   PHYSICAL EXAM:  BP (!) (P) 143/88 (BP Location: Left Arm, Patient Position: Sitting)   Pulse (P) 91   Temp (P) 98.3 F (36.8 C) (Tympanic)   Resp (P) 16   Wt (P) 241 lb 11.2 oz (109.6 kg)   BMI (P) 42.82 kg/m  Lungs are clear to A&P cardiac examination essentially unremarkable with regular rate and rhythm. No dominant mass or nodularity is noted in either breast in 2 positions examined. Incision is well-healed. No axillary or supraclavicular adenopathy is appreciated. Cosmetic result is excellent.  Well-developed well-nourished patient in NAD. HEENT reveals PERLA, EOMI, discs not visualized.  Oral cavity is clear. No oral mucosal lesions are identified. Neck is clear without evidence of cervical or supraclavicular adenopathy. Lungs are clear to A&P. Cardiac examination is essentially unremarkable with regular rate and rhythm without murmur rub or thrill. Abdomen is benign with no organomegaly or masses noted. Motor sensory and DTR levels are equal and symmetric  in the upper and lower extremities. Cranial nerves II through XII are grossly intact. Proprioception is intact. No peripheral adenopathy or edema is identified. No motor or sensory levels are noted. Crude visual fields are within normal range.  RADIOLOGY RESULTS: No current films to review  PLAN: Present time patient is doing well very low side effect profile status post whole breast radiation.  And pleased with her overall progress.  I have asked to see her back in 4 to 5 months.  She continues on tamoxifen that side effect.  We are signing a note to have limited contact with any students based on her continued tenderness of her left breast.  Patient knows to call with any concerns.  I would like to take this opportunity to thank you for allowing me to participate in the care of your patient.Noreene Filbert, MD

## 2021-09-16 ENCOUNTER — Inpatient Hospital Stay: Payer: BC Managed Care – PPO

## 2021-09-18 ENCOUNTER — Inpatient Hospital Stay: Payer: BC Managed Care – PPO

## 2021-09-18 ENCOUNTER — Inpatient Hospital Stay: Payer: BC Managed Care – PPO | Attending: Oncology | Admitting: Oncology

## 2021-09-18 ENCOUNTER — Other Ambulatory Visit: Payer: Self-pay

## 2021-09-18 ENCOUNTER — Encounter: Payer: Self-pay | Admitting: Oncology

## 2021-09-18 DIAGNOSIS — D500 Iron deficiency anemia secondary to blood loss (chronic): Secondary | ICD-10-CM

## 2021-09-18 DIAGNOSIS — C50412 Malignant neoplasm of upper-outer quadrant of left female breast: Secondary | ICD-10-CM

## 2021-09-18 DIAGNOSIS — Z170 Estrogen receptor positive status [ER+]: Secondary | ICD-10-CM

## 2021-09-18 DIAGNOSIS — D509 Iron deficiency anemia, unspecified: Secondary | ICD-10-CM | POA: Diagnosis not present

## 2021-09-18 DIAGNOSIS — Z79810 Long term (current) use of selective estrogen receptor modulators (SERMs): Secondary | ICD-10-CM | POA: Diagnosis not present

## 2021-09-18 LAB — COMPREHENSIVE METABOLIC PANEL
ALT: 13 U/L (ref 0–44)
AST: 15 U/L (ref 15–41)
Albumin: 3.8 g/dL (ref 3.5–5.0)
Alkaline Phosphatase: 33 U/L — ABNORMAL LOW (ref 38–126)
Anion gap: 6 (ref 5–15)
BUN: 11 mg/dL (ref 6–20)
CO2: 23 mmol/L (ref 22–32)
Calcium: 8.8 mg/dL — ABNORMAL LOW (ref 8.9–10.3)
Chloride: 106 mmol/L (ref 98–111)
Creatinine, Ser: 0.77 mg/dL (ref 0.44–1.00)
GFR, Estimated: >60 mL/min (ref >60)
Glucose, Bld: 112 mg/dL — ABNORMAL HIGH (ref 70–99)
Potassium: 3.9 mmol/L (ref 3.5–5.1)
Sodium: 135 mmol/L (ref 135–145)
Total Bilirubin: 0.5 mg/dL (ref 0.3–1.2)
Total Protein: 7.9 g/dL (ref 6.5–8.1)

## 2021-09-18 LAB — CBC WITH DIFFERENTIAL/PLATELET
Abs Immature Granulocytes: 0.02 10*3/uL (ref 0.00–0.07)
Basophils Absolute: 0 10*3/uL (ref 0.0–0.1)
Basophils Relative: 1 %
Eosinophils Absolute: 0.1 10*3/uL (ref 0.0–0.5)
Eosinophils Relative: 2 %
HCT: 28.6 % — ABNORMAL LOW (ref 36.0–46.0)
Hemoglobin: 8.7 g/dL — ABNORMAL LOW (ref 12.0–15.0)
Immature Granulocytes: 1 %
Lymphocytes Relative: 23 %
Lymphs Abs: 1 10*3/uL (ref 0.7–4.0)
MCH: 24 pg — ABNORMAL LOW (ref 26.0–34.0)
MCHC: 30.4 g/dL (ref 30.0–36.0)
MCV: 79 fL — ABNORMAL LOW (ref 80.0–100.0)
Monocytes Absolute: 0.4 10*3/uL (ref 0.1–1.0)
Monocytes Relative: 10 %
Neutro Abs: 2.7 10*3/uL (ref 1.7–7.7)
Neutrophils Relative %: 63 %
Platelets: 325 10*3/uL (ref 150–400)
RBC: 3.62 MIL/uL — ABNORMAL LOW (ref 3.87–5.11)
RDW: 15.8 % — ABNORMAL HIGH (ref 11.5–15.5)
WBC: 4.1 10*3/uL (ref 4.0–10.5)
nRBC: 0 % (ref 0.0–0.2)

## 2021-09-18 LAB — FERRITIN: Ferritin: 26 ng/mL (ref 11–307)

## 2021-09-18 LAB — IRON AND TIBC
Iron: 23 ug/dL — ABNORMAL LOW (ref 28–170)
Saturation Ratios: 5 % — ABNORMAL LOW (ref 10.4–31.8)
TIBC: 423 ug/dL (ref 250–450)
UIBC: 400 ug/dL

## 2021-09-18 NOTE — Progress Notes (Signed)
HEMATOLOGY-ONCOLOGY TeleHEALTH VISIT PROGRESS NOTE  I connected with Amanda Pearson on 09/18/21  at 12:15 PM EDT by video enabled telemedicine visit and verified that I am speaking with the correct person using two identifiers. I discussed the limitations, risks, security and privacy concerns of performing an evaluation and management service by telemedicine and the availability of in-person appointments. The patient expressed understanding and agreed to proceed.   Other persons participating in the visit and their role in the encounter:  None  Patient's location: At work Provider's location: office Chief Complaint: Follow-up for iron deficiency anemia and breast cancer   INTERVAL HISTORY Amanda Pearson is a 40 y.o. female who has above history reviewed by me today presents for follow up visit for management of iron deficiency anemia and breast cancer Problems and complaints are listed below:  Patient has started on tamoxifen since last visit.  Overall she tolerates well. She has not taken iron supplementation until recently. She feels that her menstrual period for not very heavy. Denies any bloody or black stool.  Denies any family history of colon cancer.  Review of Systems  Constitutional:  Positive for fatigue. Negative for appetite change, chills and fever.  HENT:   Negative for hearing loss and voice change.   Eyes:  Negative for eye problems.  Respiratory:  Negative for chest tightness and cough.   Cardiovascular:  Negative for chest pain.  Gastrointestinal:  Negative for abdominal distention, abdominal pain and blood in stool.  Endocrine: Negative for hot flashes.  Genitourinary:  Negative for difficulty urinating and frequency.   Musculoskeletal:  Negative for arthralgias.  Skin:  Negative for itching and rash.  Neurological:  Negative for extremity weakness.  Hematological:  Negative for adenopathy.  Psychiatric/Behavioral:  Negative for confusion.    Past Medical History:   Diagnosis Date   Anemia    Anemia    Anxiety    Cancer ([place])    Hypertension    Past Surgical History:  Procedure Laterality Date   BREAST BIOPSY Left 03/13/2021   [place] Bx, q-clip, IMC with mucinous features   BREAST BIOPSY Right 03/13/2021   [place] Bx, x-clip, fibroadenoma   BREAST LUMPECTOMY,RADIO FREQ LOCALIZER,AXILLARY SENTINEL LYMPH NODE BIOPSY Left 04/10/2021   Procedure: BREAST LUMPECTOMY,RADIO FREQ LOCALIZER,AXILLARY SENTINEL LYMPH NODE BIOPSY;  Surgeon: Ronny Bacon, MD;  Location: [facility] ORS;  Service: General;  Laterality: Left;   FACIAL COSMETIC SURGERY      Family History  Problem Relation Age of Onset   COPD Mother    Kidney disease Mother    Multiple sclerosis Father    Breast cancer Paternal Aunt 36    Social History   Socioeconomic History   Marital status: Married    Spouse name: Fredderick   Number of children: Not on file   Years of education: Not on file   Highest education level: Not on file  Occupational History   Not on file  Tobacco Use   Smoking status: Never   Smokeless tobacco: Never  Vaping Use   Vaping Use: Never used  Substance and Sexual Activity   Alcohol use: No   Drug use: No   Sexual activity: Yes  Other Topics Concern   Not on file  Social History Narrative   Not on file   Social Determinants of Health   Financial Resource Strain: Not on file  Food Insecurity: Not on file  Transportation Needs: Not on file  Physical Activity: Not on file  Stress: Not on file  Social  Connections: Not on file  Intimate Partner Violence: Not on file    Current Outpatient Medications on File Prior to Visit  Medication Sig Dispense Refill   amLODipine (NORVASC) 5 MG tablet Take 5 mg by mouth every morning.     Cholecalciferol (VITAMIN D-3) 125 MCG (5000 UT) TABS Take 2 tablets by mouth daily.     ibuprofen (ADVIL) 800 MG tablet Take 1 tablet (800 mg total) by mouth every 8 (eight) hours as needed. 30 tablet 0   Iron-Vitamin C (VITRON-C)  65-125 MG TABS Take 1 tablet by mouth daily. 30 tablet 2   losartan (COZAAR) 100 MG tablet Take 100 mg by mouth every morning.     omeprazole (PRILOSEC) 40 MG capsule Take by mouth.     rosuvastatin (CRESTOR) 20 MG tablet Take 20 mg by mouth at bedtime.     tamoxifen (NOLVADEX) 20 MG tablet Take 1 tablet (20 mg total) by mouth daily. 30 tablet 1   No current facility-administered medications on file prior to visit.    No Known Allergies     Observations/Objective: There were no vitals filed for this visit. There is no height or weight on file to calculate BMI.  Physical Exam Neurological:     Mental Status: She is alert.    CBC    Component Value Date/Time   WBC 4.1 09/18/2021 0759   RBC 3.62 (L) 09/18/2021 0759   HGB 8.7 (L) 09/18/2021 0759   HGB 8.5 (L) 02/09/2021 0844   HCT 28.6 (L) 09/18/2021 0759   HCT 28.4 (L) 02/09/2021 0844   PLT 325 09/18/2021 0759   MCV 79.0 (L) 09/18/2021 0759   MCV 72 (L) 02/09/2021 0844   MCH 24.0 (L) 09/18/2021 0759   MCHC 30.4 09/18/2021 0759   RDW 15.8 (H) 09/18/2021 0759   RDW 17.2 (H) 02/09/2021 0844   LYMPHSABS 1.0 09/18/2021 0759   LYMPHSABS 1.7 02/09/2021 0844   MONOABS 0.4 09/18/2021 0759   EOSABS 0.1 09/18/2021 0759   EOSABS 0.1 02/09/2021 0844   BASOSABS 0.0 09/18/2021 0759   BASOSABS 0.0 02/09/2021 0844    CMP     Component Value Date/Time   NA 135 09/18/2021 0759   NA 137 02/09/2021 0844   K 3.9 09/18/2021 0759   CL 106 09/18/2021 0759   CO2 23 09/18/2021 0759   GLUCOSE 112 (H) 09/18/2021 0759   BUN 11 09/18/2021 0759   BUN 5 (L) 02/09/2021 0844   CREATININE 0.77 09/18/2021 0759   CALCIUM 8.8 (L) 09/18/2021 0759   PROT 7.9 09/18/2021 0759   ALBUMIN 3.8 09/18/2021 0759   AST 15 09/18/2021 0759   ALT 13 09/18/2021 0759   ALKPHOS 33 (L) 09/18/2021 0759   BILITOT 0.5 09/18/2021 0759   GFRNONAA >60 09/18/2021 0759   GFRAA >60 10/16/2017 1557     Assessment and Plan: 1. Malignant neoplasm of upper-outer quadrant  of left breast in female, estrogen receptor positive ([place])   2. Iron deficiency anemia due to chronic blood loss   3. Long-term current use of tamoxifen      # Left breast pT1c pN0 breast cancer, ER/PR+, HER2/neu negative - 04/10/21  S/p left lumpectomy/SLNB- adjuvant RT Oncotype Dx 8.  Labs reviewed and discussed with patient. Clinically she tolerates tamoxifen.  Continue current regimen. Recommend annual pelvic examination by gynecology.  #Iron deficiency anemia Hemoglobin has decreased.  Possibly due to being off iron supplementation. Possible source of iron deficiency is chronic blood loss, versus malabsorption. Discussed with  patient about IV Venofer treatment option. Allergy reactions/infusion reaction including anaphylactic reaction was discussed.. Other side effects include but not limited to high blood pressure, skin rash, weight gain, leg swelling, etc. Patient voices understanding and she prefers to try oral iron supplementation with short-term follow-up.  If no improvement, she will proceed with IV Venofer treatments. Recommend patient to take vitamin C 1 tablet daily.  Repeat blood work in 6 weeks and see NP +/- Venofer Follow-up with me in 18 weeks lab MD +/- Venofer.  I discussed the assessment and treatment plan with the patient. The patient was provided an opportunity to ask questions and all were answered. The patient agreed with the plan and demonstrated an understanding of the instructions.  The patient was advised to call back or seek an in-person evaluation if the symptoms worsen or if the condition fails to improve as anticipated.   Earlie Server, MD 09/18/2021 10:08 PM

## 2021-09-18 NOTE — Progress Notes (Signed)
Patient contacted for Mychart visit. No new concerns voiced.  

## 2021-09-21 DIAGNOSIS — Z170 Estrogen receptor positive status [ER+]: Secondary | ICD-10-CM

## 2021-09-21 DIAGNOSIS — C50412 Malignant neoplasm of upper-outer quadrant of left female breast: Secondary | ICD-10-CM

## 2021-09-21 NOTE — Progress Notes (Signed)
Survivorship Care Plan visit completed.  Treatment summary reviewed and given to patient.  ASCO answers booklet reviewed and given to patient.  CARE program and Cancer Transitions discussed with patient along with other resources cancer center offers to patients and caregivers.  Patient verbalized understanding.    

## 2021-10-02 ENCOUNTER — Other Ambulatory Visit: Payer: Self-pay | Admitting: Oncology

## 2021-10-05 ENCOUNTER — Other Ambulatory Visit: Payer: Self-pay | Admitting: Oncology

## 2021-10-07 ENCOUNTER — Ambulatory Visit
Admission: RE | Admit: 2021-10-07 | Discharge: 2021-10-07 | Disposition: A | Discharge status: Home / Self Care | Payer: BC Managed Care – PPO | Source: Ambulatory Visit | Attending: Surgery | Admitting: Surgery

## 2021-10-07 ENCOUNTER — Other Ambulatory Visit: Payer: Self-pay

## 2021-10-07 DIAGNOSIS — C50412 Malignant neoplasm of upper-outer quadrant of left female breast: Secondary | ICD-10-CM

## 2021-10-07 DIAGNOSIS — Z170 Estrogen receptor positive status [ER+]: Secondary | ICD-10-CM | POA: Insufficient documentation

## 2021-10-13 ENCOUNTER — Encounter: Payer: Self-pay | Admitting: Surgery

## 2021-10-13 ENCOUNTER — Ambulatory Visit (INDEPENDENT_AMBULATORY_CARE_PROVIDER_SITE_OTHER): Payer: BC Managed Care – PPO | Admitting: Surgery

## 2021-10-13 ENCOUNTER — Other Ambulatory Visit: Payer: Self-pay

## 2021-10-13 VITALS — BP 150/92 | HR 88 | Temp 98.8°F | Ht 63.0 in | Wt 240.4 lb

## 2021-10-13 DIAGNOSIS — Z853 Personal history of malignant neoplasm of breast: Secondary | ICD-10-CM

## 2021-10-13 DIAGNOSIS — C50912 Malignant neoplasm of unspecified site of left female breast: Secondary | ICD-10-CM

## 2021-10-13 DIAGNOSIS — Z170 Estrogen receptor positive status [ER+]: Secondary | ICD-10-CM

## 2021-10-13 NOTE — Patient Instructions (Addendum)
We will contact you April 2023 to schedule your mammogram and follow up with Dr.Rodenberg.    Breast Self-Awareness Breast self-awareness means being familiar with how your breasts look and feel. It involves checking your breasts regularly and reporting any changes to your health care provider. Practicing breast self-awareness is important. Sometimes changes may not be harmful (are benign), but sometimes a change in your breasts can be a sign of a serious medical problem. It is important to learn how to do this procedure correctly so that you can catch problems early, when treatment is more likely to be successful. All women should practice breast self-awareness, including women who have had breast implants. What you need: A mirror. A well-lit room. How to do a breast self-exam A breast self-exam is one way to learn what is normal for your breasts and whether your breasts are changing. To do a breast self-exam: Look for changes  Remove all the clothing above your waist. Stand in front of a mirror in a room with good lighting. Put your hands on your hips. Push your hands firmly downward. Compare your breasts in the mirror. Look for differences between them (asymmetry), such as: Differences in shape. Differences in size. Puckers, dips, and bumps in one breast and not the other. Look at each breast for changes in the skin, such as: Redness. Scaly areas. Look for changes in your nipples, such as: Discharge. Bleeding. Dimpling. Redness. A change in position. Feel for changes Carefully feel your breasts for lumps and changes. It is best to do this while lying on your back on the floor, and again while sitting or standing in the tub or shower with soapy water on your skin. Feel each breast in the following way: Place the arm on the side of the breast you are examining above your head. Feel your breast with the other hand. Start in the nipple area and make -inch (2 cm) overlapping circles  to feel your breast. Use the pads of your three middle fingers to do this. Apply light pressure, then medium pressure, then firm pressure. The light pressure will allow you to feel the tissue closest to the skin. The medium pressure will allow you to feel the tissue that is a little deeper. The firm pressure will allow you to feel the tissue close to the ribs. Continue the overlapping circles, moving downward over the breast until you feel your ribs below your breast. Move one finger-width toward the center of the body. Continue to use the -inch (2 cm) overlapping circles to feel your breast as you move slowly up toward your collarbone. Continue the up-and-down exam using all three pressures until you reach your armpit.  Write down what you find Writing down what you find can help you remember what to discuss with your health care provider. Write down: What is normal for each breast. Any changes that you find in each breast, including: The kind of changes you find. Any pain or tenderness. Size and location of any lumps. Where you are in your menstrual cycle, if you are still menstruating. General tips and recommendations Examine your breasts every month. If you are breastfeeding, the best time to examine your breasts is after a feeding or after using a breast pump. If you menstruate, the best time to examine your breasts is 5-7 days after your period. Breasts are generally lumpier during menstrual periods, and it may be more difficult to notice changes. With time and practice, you will become more familiar with  the variations in your breasts and more comfortable with the exam. Contact a health care provider if you: See a change in the shape or size of your breasts or nipples. See a change in the skin of your breast or nipples, such as a reddened or scaly area. Have unusual discharge from your nipples. Find a lump or thick area that was not there before. Have pain in your breasts. Have any  concerns related to your breast health. Summary Breast self-awareness includes looking for physical changes in your breasts, as well as feeling for any changes within your breasts. Breast self-awareness should be performed in front of a mirror in a well-lit room. You should examine your breasts every month. If you menstruate, the best time to examine your breasts is 5-7 days after your menstrual period. Let your health care provider know of any changes you notice in your breasts, including changes in size, changes on the skin, pain or tenderness, or unusual fluid from your nipples. This information is not intended to replace advice given to you by your health care provider. Make sure you discuss any questions you have with your health care provider. Document Revised: 07/18/2018 Document Reviewed: 07/18/2018 Elsevier Patient Education  [place].

## 2021-10-13 NOTE — Progress Notes (Signed)
Surgical Clinic Progress/Follow-up Note   HPI:  40 y.o. Female presents to clinic for left breast cancer follow-up.  Left breast pT1c pN0 breast cancer, ER/PR+, HER2/neu negative - 04/10/21  S/p left lumpectomy/SLNB- adjuvant RT Oncotype Dx 8.  Clinically she tolerates tamoxifen.  Patient reports  improvement/resolution of prior issues and has been utilizing Aquaphor to maintain the skin health of her radiated scar sites.  She presents with recent follow-up left breast mammography. She denies any new breast pain, nodularity, mass or skin changes.  Somewhat disappointed in the diminishment and resolution of her radiation changes, however quite pleased with being able to complete her breast conservation therapy.  Review of Systems:  Constitutional: denies fever/chills  ENT: denies sore throat, hearing problems  Respiratory: denies shortness of breath, wheezing  Cardiovascular: denies chest pain, palpitations  Gastrointestinal: denies abdominal pain, N/V, or diarrhea/and bowel function as per interval history Skin: Denies any other rashes or skin discolorations except post-surgical wounds as per interval history  Vital Signs:  BP (!) 150/92   Pulse 88   Temp 98.8 F (37.1 C) (Oral)   Ht '5\' 3"'  (1.6 m)   Wt 240 lb 6.4 oz (109 kg)   SpO2 99%   BMI 42.58 kg/m    Physical Exam:  Constitutional:  -- Obese body habitus  -- Awake, alert, and oriented x3  Pulmonary:  -- No crackles -- Equal breath sounds bilaterally -- Breathing non-labored at rest Cardiovascular:  -- S1, S2 present  -- No pericardial rubs  Gastrointestinal:  -- Soft and non-distended, non-tender  GU  --left breast exam completed, postradiation changes noted with previously appreciated dermal thickening in the nipple areolar complex.  Scars without evidence of dimpling on the lateral left and left axillary area.  No dominant masses, nodularity or suspicious skin changes noted. Musculoskeletal / Integumentary:  -- Wounds  or skin discoloration: None appreciated except post-surgical incisions and radiation changes of the left breast as described above -- Extremities: B/L UE and LE FROM, hands and feet warm, no edema     Imaging:  CLINICAL DATA:  LEFT lumpectomy in April 2022.   EXAM: DIGITAL DIAGNOSTIC UNILATERAL LEFT MAMMOGRAM WITH TOMOSYNTHESIS AND CAD   TECHNIQUE: Left digital diagnostic mammography and breast tomosynthesis was performed. The images were evaluated with computer-aided detection.   COMPARISON:  Previous exam(s).   ACR Breast Density Category b: There are scattered areas of fibroglandular density.   FINDINGS: There is density and architectural distortion within the LEFT breast, consistent with postsurgical changes. These are new in comparison to prior. Questioned asymmetries in LEFT breast resolved with additional views, consistent with overlapping fibroglandular tissue. No suspicious mass, distortion, or microcalcifications are identified to suggest presence of malignancy.   IMPRESSION: No mammographic evidence of malignancy in the LEFT breast status post LEFT lumpectomy.   RECOMMENDATION: Recommend bilateral diagnostic mammogram in March/April 2023 as patient is due for contralateral screening at this point in time.   Given patient's history of a breast cancer diagnosis at a young age, supplemental annual screening with breast MRI with contrast should be considered. The American Cancer Society recommends annual MRI and mammography in patients with an estimated lifetime risk of developing breast cancer greater than 20 - 25%, or who are known or suspected to be positive for the breast cancer gene.   I have discussed the findings and recommendations with the patient. If applicable, a reminder letter will be sent to the patient regarding the next appointment.   BI-RADS CATEGORY  2:  Benign.     Electronically Signed   By: Valentino Saxon M.D.   On: 10/07/2021  10:24  Assessment:  40 y.o. yo Female with a problem list including...  Patient Active Problem List   Diagnosis Date Noted   Malignant neoplasm of upper-outer quadrant of left breast in female, estrogen receptor positive ([place]) 04/21/2021   Breast cancer, stage 1, estrogen receptor positive, left ([place]) 03/17/2021   Family history of breast cancer 03/17/2021    presents to clinic for follow-up evaluation of left breast cancer, progressing well.  Plan:              - return to clinic in 6 months months to 1 year or as needed, instructed to call office if any questions or concerns  All of the above recommendations were discussed with the patient And all of patient's questions were answered to her expressed satisfaction.  These notes generated with voice recognition software. I apologize for typographical errors.  Ronny Bacon, MD, FACS [place]: [facility] exceptional care. Office: [phone]

## 2021-10-28 ENCOUNTER — Other Ambulatory Visit: Payer: Self-pay

## 2021-10-28 ENCOUNTER — Encounter: Payer: Self-pay | Admitting: Oncology

## 2021-10-28 ENCOUNTER — Other Ambulatory Visit: Payer: Self-pay | Admitting: Oncology

## 2021-10-28 ENCOUNTER — Inpatient Hospital Stay: Payer: BC Managed Care – PPO | Attending: Oncology

## 2021-10-28 DIAGNOSIS — D508 Other iron deficiency anemias: Secondary | ICD-10-CM

## 2021-10-28 DIAGNOSIS — D509 Iron deficiency anemia, unspecified: Secondary | ICD-10-CM | POA: Insufficient documentation

## 2021-10-28 DIAGNOSIS — Z170 Estrogen receptor positive status [ER+]: Secondary | ICD-10-CM

## 2021-10-28 DIAGNOSIS — C50412 Malignant neoplasm of upper-outer quadrant of left female breast: Secondary | ICD-10-CM | POA: Insufficient documentation

## 2021-10-28 HISTORY — DX: Iron deficiency anemia, unspecified: D50.9

## 2021-10-28 LAB — CBC WITH DIFFERENTIAL/PLATELET
Abs Immature Granulocytes: 0.02 10*3/uL (ref 0.00–0.07)
Basophils Absolute: 0 10*3/uL (ref 0.0–0.1)
Basophils Relative: 1 %
Eosinophils Absolute: 0.1 10*3/uL (ref 0.0–0.5)
Eosinophils Relative: 1 %
HCT: 30.2 % — ABNORMAL LOW (ref 36.0–46.0)
Hemoglobin: 9.2 g/dL — ABNORMAL LOW (ref 12.0–15.0)
Immature Granulocytes: 0 %
Lymphocytes Relative: 21 %
Lymphs Abs: 1.3 10*3/uL (ref 0.7–4.0)
MCH: 24.1 pg — ABNORMAL LOW (ref 26.0–34.0)
MCHC: 30.5 g/dL (ref 30.0–36.0)
MCV: 79.3 fL — ABNORMAL LOW (ref 80.0–100.0)
Monocytes Absolute: 0.4 10*3/uL (ref 0.1–1.0)
Monocytes Relative: 6 %
Neutro Abs: 4.5 10*3/uL (ref 1.7–7.7)
Neutrophils Relative %: 71 %
Platelets: 318 10*3/uL (ref 150–400)
RBC: 3.81 MIL/uL — ABNORMAL LOW (ref 3.87–5.11)
RDW: 15.9 % — ABNORMAL HIGH (ref 11.5–15.5)
WBC: 6.3 10*3/uL (ref 4.0–10.5)
nRBC: 0 % (ref 0.0–0.2)

## 2021-10-28 LAB — FERRITIN: Ferritin: 26 ng/mL (ref 11–307)

## 2021-10-28 LAB — IRON AND TIBC
Iron: 34 ug/dL (ref 28–170)
Saturation Ratios: 9 % — ABNORMAL LOW (ref 10.4–31.8)
TIBC: 395 ug/dL (ref 250–450)
UIBC: 361 ug/dL

## 2021-10-30 ENCOUNTER — Other Ambulatory Visit: Payer: Self-pay

## 2021-10-30 ENCOUNTER — Inpatient Hospital Stay (HOSPITAL_BASED_OUTPATIENT_CLINIC_OR_DEPARTMENT_OTHER): Payer: BC Managed Care – PPO | Admitting: Nurse Practitioner

## 2021-10-30 ENCOUNTER — Inpatient Hospital Stay: Payer: BC Managed Care – PPO

## 2021-10-30 VITALS — BP 137/86 | HR 98 | Temp 98.7°F | Wt 240.1 lb

## 2021-10-30 DIAGNOSIS — D508 Other iron deficiency anemias: Secondary | ICD-10-CM | POA: Diagnosis not present

## 2021-10-30 DIAGNOSIS — C50412 Malignant neoplasm of upper-outer quadrant of left female breast: Secondary | ICD-10-CM

## 2021-10-30 DIAGNOSIS — Z803 Family history of malignant neoplasm of breast: Secondary | ICD-10-CM

## 2021-10-30 DIAGNOSIS — Z170 Estrogen receptor positive status [ER+]: Secondary | ICD-10-CM

## 2021-10-30 MED ORDER — SODIUM CHLORIDE 0.9 % IV SOLN: 200.0000 mg | Freq: Once | INTRAVENOUS | Status: DC

## 2021-10-30 MED ORDER — SODIUM CHLORIDE 0.9 % IV SOLN
Freq: Once | INTRAVENOUS | Status: AC
Start: 2021-10-30 — End: 2021-10-30
  Filled 2021-10-30: qty 250

## 2021-10-30 MED ORDER — IRON SUCROSE 20 MG/ML IV SOLN
200.0000 mg | Freq: Once | INTRAVENOUS | Status: AC
  Administered 2021-10-30: 200 mg via INTRAVENOUS
  Filled 2021-10-30: qty 10

## 2021-10-30 NOTE — Progress Notes (Signed)
Hematology/Oncology Consult Note [facility] Telephone:([phone] [phone]   Patient Care Team: Danelle Berry, NP as PCP - General (Nurse Practitioner) Rico Junker, RN as Oncology Nurse Navigator Noreene Filbert, MD as Referring Physician (Radiation Oncology) Earlie Server, MD as Consulting Physician (Oncology) Ronny Bacon, MD as Consulting Physician (General Surgery)  REFERRING PROVIDER: Danelle Berry, NP   CHIEF COMPLAINTS/REASON FOR VISIT:   Follow up for left breast cancer.   HISTORY OF PRESENTING ILLNESS: Amanda Pearson is a  40 y.o.  female with PMH listed below was seen in consultation at the request of  Danelle Berry, NP  for evaluation of left breast cancer.  Patient was seen recently by gynecology for physical. There was a mass palpated in the left breast.  Patient was recommended to proceed with diagnostic mammogram.  03/09/2021, bilateral diagnostic mammogram and ultrasound showed Left breast 0.9 x 0.6 x 1.1 cm slight lobulated hypoechoic mass at 2:00 11 cm from the nipple.  Ultrasound of the left axillary is negative Right breast 1 x 0.5x 0.7 cm 9:00 15 cm from nipple Patient was recommended for 20-monthfollow-up for needle biopsy.  Patient opted have biopsy of both mass.  03/13/2021, patient went biopsy of both breast masses. 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  Grade 2 ER/PR/HER-2 status is pending.  Patient was referred to establish care with oncology.  She also has an appointment with Dr. RLutricia Feilnext week Patient was accompanied by her husband. She is very anxious and feels overwhelmed after receiving the news. Denies any breast pain, nipple discharge. Menarche  -89to 40years old She has no children. History of OCP use for about a year.  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 pT1c pN0                                                        INTERVAL HISTORY Amanda Pearson a 40y.o. female with above history who returns to clinic for labs and consideration of IV iron. She has seen surgery for post op post lumpectomy. Tolerating tamoxifen. Feels well with mild fatigue. No other complaints.   Review of Systems  Constitutional:  Negative for appetite change, chills, fatigue and fever.  HENT:   Negative for hearing loss and voice change.   Eyes:  Negative for eye problems.  Respiratory:  Negative for chest tightness and cough.   Cardiovascular:  Negative for chest pain.  Gastrointestinal:  Negative for abdominal distention, abdominal pain and blood in stool.  Endocrine: Negative for hot flashes.  Genitourinary:  Negative for difficulty urinating and frequency.   Musculoskeletal:  Negative for arthralgias.  Skin:  Negative for itching and rash.  Neurological:  Negative for extremity weakness.  Hematological:  Negative for adenopathy.  Psychiatric/Behavioral:  Negative for confusion. The patient is not nervous/anxious.     MEDICAL HISTORY:  Past Medical  History:  Diagnosis Date   Anemia    Anemia    Anxiety    Cancer (HCC)    Hypertension    IDA (iron deficiency anemia) 10/28/2021    SURGICAL HISTORY: Past Surgical History:  Procedure Laterality Date   BREAST BIOPSY Left 03/13/2021   [place] Bx, q-clip, IMC with mucinous features   BREAST BIOPSY Right 03/13/2021   [place] Bx, x-clip, fibroadenoma   BREAST LUMPECTOMY,RADIO FREQ LOCALIZER,AXILLARY SENTINEL LYMPH NODE BIOPSY Left 04/10/2021   Procedure: BREAST LUMPECTOMY,RADIO FREQ LOCALIZER,AXILLARY SENTINEL LYMPH NODE BIOPSY;  Surgeon:  Ronny Bacon, MD;  Location: [facility] ORS;  Service: General;  Laterality: Left;   FACIAL COSMETIC SURGERY      SOCIAL HISTORY: Social History   Socioeconomic History   Marital status: Married    Spouse name: Fredderick   Number of children: Not on file   Years of education: Not on file   Highest education level: Not on file  Occupational History   Not on file  Tobacco Use   Smoking status: Never   Smokeless tobacco: Never  Vaping Use   Vaping Use: Never used  Substance and Sexual Activity   Alcohol use: No   Drug use: No   Sexual activity: Yes  Other Topics Concern   Not on file  Social History Narrative   Not on file   Social Determinants of Health   Financial Resource Strain: Not on file  Food Insecurity: Not on file  Transportation Needs: Not on file  Physical Activity: Not on file  Stress: Not on file  Social Connections: Not on file  Intimate Partner Violence: Not on file    FAMILY HISTORY: Family History  Problem Relation Age of Onset   COPD Mother    Kidney disease Mother    Multiple sclerosis Father    Breast cancer Paternal Aunt 37    ALLERGIES:  has No Known Allergies.  MEDICATIONS:  Current Outpatient Medications  Medication Sig Dispense Refill   amLODipine (NORVASC) 5 MG tablet Take 5 mg by mouth every morning.     Cholecalciferol (VITAMIN D-3) 125 MCG (5000 UT) TABS Take 2 tablets by mouth daily.     ibuprofen (ADVIL) 800 MG tablet Take 1 tablet (800 mg total) by mouth every 8 (eight) hours as needed. 30 tablet 0   Iron-Vitamin C (VITRON-C) 65-125 MG TABS Take 1 tablet by mouth daily. 30 tablet 2   losartan (COZAAR) 100 MG tablet Take 100 mg by mouth every morning.     omeprazole (PRILOSEC) 40 MG capsule Take by mouth.     rosuvastatin (CRESTOR) 20 MG tablet Take 20 mg by mouth at bedtime.     tamoxifen (NOLVADEX) 20 MG tablet Take 1 tablet by mouth once daily 30 tablet 0   No current facility-administered medications for this visit.      PHYSICAL EXAMINATION: ECOG PERFORMANCE STATUS: 0 - Asymptomatic Vitals:   10/30/21 1405  BP: 137/86  Pulse: 98  Temp: 98.7 F (37.1 C)  SpO2: 100%   Filed Weights   10/30/21 1405  Weight: 240 lb 1.6 oz (108.9 kg)    Physical Exam Constitutional:      General: She is not in acute distress.    Appearance: She is well-developed. She is not ill-appearing.  HENT:     Mouth/Throat:     Pharynx: No oropharyngeal exudate.  Eyes:     General: No scleral icterus. Cardiovascular:     Rate and Rhythm: Normal rate and regular rhythm.  Pulmonary:     Effort: Pulmonary effort is normal.     Breath sounds: Normal breath sounds.  Abdominal:     General: There is no distension.     Palpations: Abdomen is soft.     Tenderness: There is no abdominal tenderness. There is no rebound.  Musculoskeletal:        General: No tenderness or deformity.     Right lower leg: No edema.     Left lower leg: No edema.  Skin:    General: Skin is warm and dry.     Coloration: Skin is not pale.  Neurological:     General: No focal deficit present.     Mental Status: She is alert and oriented to person, place, and time.     Motor: No weakness.  Psychiatric:        Mood and Affect: Mood normal.        Behavior: Behavior normal.    LABORATORY DATA:  I have reviewed the data as listed Lab Results  Component Value Date   WBC 6.3 10/28/2021   HGB 9.2 (L) 10/28/2021   HCT 30.2 (L) 10/28/2021   MCV 79.3 (L) 10/28/2021   PLT 318 10/28/2021   Recent Labs    02/09/21 0844 03/17/21 1519 09/18/21 0759  NA 137 135 135  K 4.6 3.6 3.9  CL 105 105 106  CO2 20 21* 23  GLUCOSE 97 98 112*  BUN 5* 13 11  CREATININE 0.69 0.63 0.77  CALCIUM 9.1 8.8* 8.8*  GFRNONAA  --  >60 >60  PROT  --  8.6* 7.9  ALBUMIN  --  4.1 3.8  AST  --  15 15  ALT  --  12 13  ALKPHOS  --  43 33*  BILITOT  --  0.7 0.5    Iron/TIBC/Ferritin/ %Sat    Component Value Date/Time   IRON 34 10/28/2021 1402   TIBC 395  10/28/2021 1402   FERRITIN 26 10/28/2021 1402   IRONPCTSAT 9 (L) 10/28/2021 1402      RADIOGRAPHIC STUDIES: I have personally reviewed the radiological images as listed and agreed with the findings in the report.   ASSESSMENT & PLAN:  No diagnosis found.  Left breast pT1c pN0 breast cancer, ER/PR+, HER2/neu negative - 04/10/21. S/p left lumpectomy/SLNB- adjuvant RT. Oncotype Dx 8. On adjuvant tamoxifen. Tolerating well. Reviewed annual pelvic exam recommendation.  Iron Deficiency Anemia- Hmg 9.2, Microcytic. Ferritin 26. Iron saturation 9%. Proceed with IV iron today. Reviewed risks, benefits, alternatives. She wishes to proceed with venofer x 5.  Radiation dermatitis. Anticipate recovery in the next few weeks. Follow up with Radonc.  Family history of breast cancer- she's not yet seen genetics. Will resend referral  Venofer x 5 Follow up with Dr Tasia Catchings as scheduled   All questions were answered. The patient knows to call the clinic with any problems questions or concerns.   Beckey Rutter, DNP, AGNP-C [place] at [facility] [phone] (clinic) 10/30/2021  cc Danelle Berry, NP

## 2021-10-30 NOTE — Patient Instructions (Signed)

## 2021-10-30 NOTE — Progress Notes (Signed)
Pt has no concerns or complaints at this time.

## 2021-10-31 ENCOUNTER — Other Ambulatory Visit: Payer: Self-pay | Admitting: Oncology

## 2021-11-02 ENCOUNTER — Encounter: Payer: Self-pay | Admitting: Oncology

## 2021-11-02 ENCOUNTER — Other Ambulatory Visit: Payer: Self-pay | Admitting: Oncology

## 2021-11-02 MED ORDER — TAMOXIFEN CITRATE 20 MG PO TABS: 20.0000 mg | ORAL_TABLET | Freq: Every day | ORAL | 0 refills | Status: DC

## 2021-11-04 ENCOUNTER — Inpatient Hospital Stay: Payer: BC Managed Care – PPO

## 2021-11-04 ENCOUNTER — Other Ambulatory Visit: Payer: Self-pay

## 2021-11-04 VITALS — BP 145/79 | HR 83 | Temp 98.5°F

## 2021-11-04 DIAGNOSIS — C50412 Malignant neoplasm of upper-outer quadrant of left female breast: Secondary | ICD-10-CM | POA: Diagnosis not present

## 2021-11-04 DIAGNOSIS — D508 Other iron deficiency anemias: Secondary | ICD-10-CM

## 2021-11-04 MED ORDER — SODIUM CHLORIDE 0.9 % IV SOLN: 200.0000 mg | Freq: Once | INTRAVENOUS | Status: DC

## 2021-11-04 MED ORDER — IRON SUCROSE 20 MG/ML IV SOLN
200.0000 mg | Freq: Once | INTRAVENOUS | Status: AC
  Administered 2021-11-04: 200 mg via INTRAVENOUS
  Filled 2021-11-04: qty 10

## 2021-11-04 MED ORDER — SODIUM CHLORIDE 0.9 % IV SOLN
Freq: Once | INTRAVENOUS | Status: AC
  Filled 2021-11-04: qty 250

## 2021-11-04 NOTE — Patient Instructions (Signed)

## 2021-11-11 ENCOUNTER — Encounter: Payer: Self-pay | Admitting: Oncology

## 2021-11-11 ENCOUNTER — Telehealth: Payer: Self-pay | Admitting: Oncology

## 2021-11-11 NOTE — Telephone Encounter (Signed)
Pt changed and aware of new appt

## 2021-11-11 NOTE — Telephone Encounter (Signed)
Pt called in to cancel her appt for 12-1 for infusion. Could you give her a call to reschedule at [phone]

## 2021-11-12 ENCOUNTER — Inpatient Hospital Stay: Payer: BC Managed Care – PPO

## 2021-11-19 ENCOUNTER — Inpatient Hospital Stay: Payer: BC Managed Care – PPO | Attending: Oncology

## 2021-11-19 ENCOUNTER — Other Ambulatory Visit: Payer: Self-pay

## 2021-11-19 VITALS — BP 145/81 | HR 89 | Temp 98.3°F

## 2021-11-19 DIAGNOSIS — D509 Iron deficiency anemia, unspecified: Secondary | ICD-10-CM | POA: Insufficient documentation

## 2021-11-19 DIAGNOSIS — D508 Other iron deficiency anemias: Secondary | ICD-10-CM

## 2021-11-19 MED ORDER — IRON SUCROSE 20 MG/ML IV SOLN
200.0000 mg | Freq: Once | INTRAVENOUS | Status: AC
  Administered 2021-11-19: 200 mg via INTRAVENOUS
  Filled 2021-11-19: qty 10

## 2021-11-19 MED ORDER — SODIUM CHLORIDE 0.9 % IV SOLN
Freq: Once | INTRAVENOUS | Status: AC
  Filled 2021-11-19: qty 250

## 2021-11-19 MED ORDER — SODIUM CHLORIDE 0.9 % IV SOLN: 200.0000 mg | Freq: Once | INTRAVENOUS | Status: DC

## 2021-11-19 NOTE — Patient Instructions (Signed)

## 2021-11-26 ENCOUNTER — Inpatient Hospital Stay: Payer: BC Managed Care – PPO

## 2021-11-26 ENCOUNTER — Other Ambulatory Visit: Payer: Self-pay

## 2021-11-26 VITALS — BP 149/87 | HR 95 | Temp 95.8°F

## 2021-11-26 DIAGNOSIS — D508 Other iron deficiency anemias: Secondary | ICD-10-CM

## 2021-11-26 DIAGNOSIS — D509 Iron deficiency anemia, unspecified: Secondary | ICD-10-CM | POA: Diagnosis not present

## 2021-11-26 MED ORDER — SODIUM CHLORIDE 0.9 % IV SOLN: 200.0000 mg | Freq: Once | INTRAVENOUS | Status: DC

## 2021-11-26 MED ORDER — IRON SUCROSE 20 MG/ML IV SOLN
200.0000 mg | Freq: Once | INTRAVENOUS | Status: AC
  Administered 2021-11-26: 200 mg via INTRAVENOUS
  Filled 2021-11-26: qty 10

## 2021-11-26 MED ORDER — SODIUM CHLORIDE 0.9 % IV SOLN
Freq: Once | INTRAVENOUS | Status: AC
  Filled 2021-11-26: qty 250

## 2021-12-02 ENCOUNTER — Other Ambulatory Visit: Payer: Self-pay | Admitting: Oncology

## 2021-12-03 ENCOUNTER — Encounter: Payer: Self-pay | Admitting: Oncology

## 2021-12-03 ENCOUNTER — Other Ambulatory Visit: Payer: Self-pay

## 2021-12-03 ENCOUNTER — Inpatient Hospital Stay: Payer: BC Managed Care – PPO

## 2021-12-03 VITALS — BP 120/74 | HR 89 | Temp 97.4°F

## 2021-12-03 DIAGNOSIS — D509 Iron deficiency anemia, unspecified: Secondary | ICD-10-CM | POA: Diagnosis not present

## 2021-12-03 DIAGNOSIS — D508 Other iron deficiency anemias: Secondary | ICD-10-CM

## 2021-12-03 MED ORDER — SODIUM CHLORIDE 0.9 % IV SOLN
Freq: Once | INTRAVENOUS | Status: AC
  Filled 2021-12-03: qty 250

## 2021-12-03 MED ORDER — IRON SUCROSE 20 MG/ML IV SOLN
200.0000 mg | Freq: Once | INTRAVENOUS | Status: AC
  Administered 2021-12-03: 14:00:00 200 mg via INTRAVENOUS
  Filled 2021-12-03: qty 10

## 2021-12-03 MED ORDER — SODIUM CHLORIDE 0.9 % IV SOLN: 200.0000 mg | Freq: Once | INTRAVENOUS | Status: DC

## 2022-01-12 ENCOUNTER — Other Ambulatory Visit: Payer: Self-pay | Admitting: *Deleted

## 2022-01-12 DIAGNOSIS — Z170 Estrogen receptor positive status [ER+]: Secondary | ICD-10-CM

## 2022-01-12 DIAGNOSIS — C50412 Malignant neoplasm of upper-outer quadrant of left female breast: Secondary | ICD-10-CM

## 2022-01-12 DIAGNOSIS — D500 Iron deficiency anemia secondary to blood loss (chronic): Secondary | ICD-10-CM

## 2022-01-18 ENCOUNTER — Ambulatory Visit
Admission: RE | Admit: 2022-01-18 | Discharge: 2022-01-18 | Disposition: A | Discharge status: Home / Self Care | Payer: BC Managed Care – PPO | Source: Ambulatory Visit | Attending: Radiation Oncology | Admitting: Radiation Oncology

## 2022-01-18 ENCOUNTER — Encounter: Payer: Self-pay | Admitting: Radiation Oncology

## 2022-01-18 ENCOUNTER — Other Ambulatory Visit: Payer: Self-pay

## 2022-01-18 VITALS — BP 153/95 | HR 84 | Temp 98.6°F | Resp 16 | Wt 244.1 lb

## 2022-01-18 DIAGNOSIS — Z923 Personal history of irradiation: Secondary | ICD-10-CM | POA: Insufficient documentation

## 2022-01-18 DIAGNOSIS — C50412 Malignant neoplasm of upper-outer quadrant of left female breast: Secondary | ICD-10-CM | POA: Insufficient documentation

## 2022-01-18 DIAGNOSIS — Z79810 Long term (current) use of selective estrogen receptor modulators (SERMs): Secondary | ICD-10-CM | POA: Insufficient documentation

## 2022-01-18 DIAGNOSIS — Z170 Estrogen receptor positive status [ER+]: Secondary | ICD-10-CM | POA: Diagnosis not present

## 2022-01-18 NOTE — Progress Notes (Signed)
Radiation Oncology Follow up Note  Name: Amanda Pearson   Date:   01/18/2022 MRN:  [id number] DOB: [birth year]    This 41 y.o. 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  Seen today in routine follow-up she is doing well.  She specifically denies breast tenderness cough or bone pain..She is currently on tamoxifen tolerating that well without side effects.  She had a unilateral left mammogram back in October which I have reviewed was BI-RADS 2 benign  COMPLICATIONS OF TREATMENT: none  FOLLOW UP COMPLIANCE: keeps appointments   PHYSICAL EXAM:  BP (!) 153/95 (BP Location: Right Arm, Patient Position: Sitting)    Pulse 84    Temp 98.6 F (37 C) (Tympanic)    Resp 16    Wt 244 lb 1.6 oz (110.7 kg)    BMI 43.24 kg/m   lungs are clear to A&P cardiac examination essentially unremarkable with regular rate and rhythm. No dominant mass or nodularity is noted in either breast in 2 positions examined. Incision is well-healed. No axillary or supraclavicular adenopathy is appreciated. Cosmetic result is excellent. Well-developed well-nourished patient in NAD. HEENT reveals PERLA, EOMI, discs not visualized.  Oral cavity is clear. No oral mucosal lesions are identified. Neck is clear without evidence of cervical or supraclavicular adenopathy. Lungs are clear to A&P. Cardiac examination is essentially unremarkable with regular rate and rhythm without murmur rub or thrill. Abdomen is benign with no organomegaly or masses noted. Motor sensory and DTR levels are equal and symmetric in the upper and  lower extremities. Cranial nerves II through XII are grossly intact. Proprioception is intact. No peripheral adenopathy or edema is identified. No motor or sensory levels are noted. Crude visual fields are within normal range.  RADIOLOGY RESULTS: Unilateral left mammogram reviewed compatible with above-stated findings  PLAN: Present time she is now 6 months out doing well with no evidence of disease.  I am pleased with her overall progress.  I have asked to see her back in 6 months and I will start once year follow-up visits.  Patient is to call with any concerns.  She continues on tamoxifen without side effect.  I would like to take this opportunity to thank you for allowing me to participate in the care of your patient.Noreene Filbert, MD

## 2022-01-19 ENCOUNTER — Inpatient Hospital Stay: Payer: BC Managed Care – PPO | Admitting: Licensed Clinical Social Worker

## 2022-01-19 ENCOUNTER — Inpatient Hospital Stay: Payer: BC Managed Care – PPO

## 2022-01-21 ENCOUNTER — Inpatient Hospital Stay: Payer: BC Managed Care – PPO

## 2022-01-21 ENCOUNTER — Inpatient Hospital Stay: Payer: BC Managed Care – PPO | Admitting: Oncology

## 2022-02-03 ENCOUNTER — Encounter: Payer: Self-pay | Admitting: Licensed Clinical Social Worker

## 2022-02-05 ENCOUNTER — Inpatient Hospital Stay: Payer: BC Managed Care – PPO | Attending: Oncology | Admitting: Licensed Clinical Social Worker

## 2022-02-05 ENCOUNTER — Inpatient Hospital Stay: Payer: BC Managed Care – PPO

## 2022-02-08 ENCOUNTER — Other Ambulatory Visit: Payer: Self-pay

## 2022-02-08 DIAGNOSIS — Z170 Estrogen receptor positive status [ER+]: Secondary | ICD-10-CM

## 2022-02-08 DIAGNOSIS — C50912 Malignant neoplasm of unspecified site of left female breast: Secondary | ICD-10-CM

## 2022-02-12 ENCOUNTER — Inpatient Hospital Stay: Payer: BC Managed Care – PPO | Attending: Oncology

## 2022-02-12 ENCOUNTER — Other Ambulatory Visit: Payer: Self-pay

## 2022-02-12 DIAGNOSIS — D500 Iron deficiency anemia secondary to blood loss (chronic): Secondary | ICD-10-CM

## 2022-02-12 DIAGNOSIS — Z79810 Long term (current) use of selective estrogen receptor modulators (SERMs): Secondary | ICD-10-CM | POA: Diagnosis not present

## 2022-02-12 DIAGNOSIS — D509 Iron deficiency anemia, unspecified: Secondary | ICD-10-CM | POA: Diagnosis present

## 2022-02-12 DIAGNOSIS — Z170 Estrogen receptor positive status [ER+]: Secondary | ICD-10-CM | POA: Insufficient documentation

## 2022-02-12 DIAGNOSIS — D508 Other iron deficiency anemias: Secondary | ICD-10-CM | POA: Diagnosis not present

## 2022-02-12 DIAGNOSIS — C50412 Malignant neoplasm of upper-outer quadrant of left female breast: Secondary | ICD-10-CM | POA: Diagnosis not present

## 2022-02-12 LAB — IRON AND TIBC
Iron: 50 ug/dL (ref 28–170)
Saturation Ratios: 14 % (ref 10.4–31.8)
TIBC: 347 ug/dL (ref 250–450)
UIBC: 297 ug/dL

## 2022-02-12 LAB — CBC WITH DIFFERENTIAL/PLATELET
Abs Immature Granulocytes: 0.02 10*3/uL (ref 0.00–0.07)
Basophils Absolute: 0 10*3/uL (ref 0.0–0.1)
Basophils Relative: 0 %
Eosinophils Absolute: 0 10*3/uL (ref 0.0–0.5)
Eosinophils Relative: 1 %
HCT: 31.1 % — ABNORMAL LOW (ref 36.0–46.0)
Hemoglobin: 10.2 g/dL — ABNORMAL LOW (ref 12.0–15.0)
Immature Granulocytes: 0 %
Lymphocytes Relative: 20 %
Lymphs Abs: 1 10*3/uL (ref 0.7–4.0)
MCH: 29.2 pg (ref 26.0–34.0)
MCHC: 32.8 g/dL (ref 30.0–36.0)
MCV: 89.1 fL (ref 80.0–100.0)
Monocytes Absolute: 0.4 10*3/uL (ref 0.1–1.0)
Monocytes Relative: 8 %
Neutro Abs: 3.4 10*3/uL (ref 1.7–7.7)
Neutrophils Relative %: 71 %
Platelets: 306 10*3/uL (ref 150–400)
RBC: 3.49 MIL/uL — ABNORMAL LOW (ref 3.87–5.11)
RDW: 14 % (ref 11.5–15.5)
WBC: 4.9 10*3/uL (ref 4.0–10.5)
nRBC: 0 % (ref 0.0–0.2)

## 2022-02-12 LAB — COMPREHENSIVE METABOLIC PANEL
ALT: 17 U/L (ref 0–44)
AST: 17 U/L (ref 15–41)
Albumin: 3.6 g/dL (ref 3.5–5.0)
Alkaline Phosphatase: 30 U/L — ABNORMAL LOW (ref 38–126)
Anion gap: 6 (ref 5–15)
BUN: 11 mg/dL (ref 6–20)
CO2: 23 mmol/L (ref 22–32)
Calcium: 8.8 mg/dL — ABNORMAL LOW (ref 8.9–10.3)
Chloride: 105 mmol/L (ref 98–111)
Creatinine, Ser: 0.71 mg/dL (ref 0.44–1.00)
GFR, Estimated: >60 mL/min (ref >60)
Glucose, Bld: 105 mg/dL — ABNORMAL HIGH (ref 70–99)
Potassium: 3.7 mmol/L (ref 3.5–5.1)
Sodium: 134 mmol/L — ABNORMAL LOW (ref 135–145)
Total Bilirubin: 0.3 mg/dL (ref 0.3–1.2)
Total Protein: 7.5 g/dL (ref 6.5–8.1)

## 2022-02-12 LAB — FERRITIN: Ferritin: 102 ng/mL (ref 11–307)

## 2022-02-15 ENCOUNTER — Inpatient Hospital Stay: Payer: BC Managed Care – PPO

## 2022-02-15 ENCOUNTER — Telehealth: Payer: Self-pay | Admitting: *Deleted

## 2022-02-15 ENCOUNTER — Encounter: Payer: Self-pay | Admitting: Oncology

## 2022-02-15 ENCOUNTER — Inpatient Hospital Stay (HOSPITAL_BASED_OUTPATIENT_CLINIC_OR_DEPARTMENT_OTHER): Payer: BC Managed Care – PPO | Admitting: Oncology

## 2022-02-15 ENCOUNTER — Other Ambulatory Visit: Payer: Self-pay

## 2022-02-15 DIAGNOSIS — Z170 Estrogen receptor positive status [ER+]: Secondary | ICD-10-CM | POA: Diagnosis not present

## 2022-02-15 DIAGNOSIS — C50412 Malignant neoplasm of upper-outer quadrant of left female breast: Secondary | ICD-10-CM

## 2022-02-15 DIAGNOSIS — D508 Other iron deficiency anemias: Secondary | ICD-10-CM | POA: Diagnosis not present

## 2022-02-15 DIAGNOSIS — Z79810 Long term (current) use of selective estrogen receptor modulators (SERMs): Secondary | ICD-10-CM

## 2022-02-15 NOTE — Progress Notes (Signed)
HEMATOLOGY-ONCOLOGY TeleHEALTH VISIT PROGRESS NOTE  ?I connected with Amanda Pearson on 02/15/22  at  2:15 PM EST by video enabled telemedicine visit and verified that I am speaking with the correct person using two identifiers. ?I discussed the limitations, risks, security and privacy concerns of performing an evaluation and management service by telemedicine and the availability of in-person appointments. The patient expressed understanding and agreed to proceed.  ? ?Other persons participating in the visit and their role in the encounter:  ?None ? ?Patient's location: At work ?Provider's location: office ?Chief Complaint: Follow-up for iron deficiency anemia and breast cancer ? ? ?INTERVAL HISTORY ?Amanda Pearson is a 41 y.o. female who has above history reviewed by me today presents for follow up visit for management of iron deficiency anemia and breast cancer ?Problems and complaints are listed below:  ?Patient takes Tamoxifen, she feels well. Tolerates medication, no new complaints.  ?S/p IV venofer after last visit. Fatigue has improved.  ? ? ?Review of Systems  ?Constitutional:  Positive for fatigue. Negative for appetite change, chills and fever.  ?HENT:   Negative for hearing loss and voice change.   ?Eyes:  Negative for eye problems.  ?Respiratory:  Negative for chest tightness and cough.   ?Cardiovascular:  Negative for chest pain.  ?Gastrointestinal:  Negative for abdominal distention, abdominal pain and blood in stool.  ?Endocrine: Negative for hot flashes.  ?Genitourinary:  Negative for difficulty urinating and frequency.   ?Musculoskeletal:  Negative for arthralgias.  ?Skin:  Negative for itching and rash.  ?Neurological:  Negative for extremity weakness.  ?Hematological:  Negative for adenopathy.  ?Psychiatric/Behavioral:  Negative for confusion.    ?Past Medical History:  ?Diagnosis Date  ? Anemia   ? Anemia   ? Anxiety   ? Cancer [facility])   ? Hypertension   ? IDA (iron deficiency anemia) 10/28/2021   ? ?Past Surgical History:  ?Procedure Laterality Date  ? BREAST BIOPSY Left 03/13/2021  ? [place] Bx, q-clip, IMC with mucinous features  ? BREAST BIOPSY Right 03/13/2021  ? [place] Bx, x-clip, fibroadenoma  ? BREAST LUMPECTOMY,RADIO FREQ LOCALIZER,AXILLARY SENTINEL LYMPH NODE BIOPSY Left 04/10/2021  ? Procedure: BREAST LUMPECTOMY,RADIO FREQ LOCALIZER,AXILLARY SENTINEL LYMPH NODE BIOPSY;  Surgeon: Ronny Bacon, MD;  Location: [facility] ORS;  Service: General;  Laterality: Left;  ? FACIAL COSMETIC SURGERY    ?  ?Family History  ?Problem Relation Age of Onset  ? COPD Mother   ? Kidney disease Mother   ? Multiple sclerosis Father   ? Breast cancer Paternal Aunt 67  ?  ?Social History  ? ?Socioeconomic History  ? Marital status: Married  ?  Spouse name: Fredderick  ? Number of children: Not on file  ? Years of education: Not on file  ? Highest education level: Not on file  ?Occupational History  ? Not on file  ?Tobacco Use  ? Smoking status: Never  ? Smokeless tobacco: Never  ?Vaping Use  ? Vaping Use: Never used  ?Substance and Sexual Activity  ? Alcohol use: No  ? Drug use: No  ? Sexual activity: Yes  ?Other Topics Concern  ? Not on file  ?Social History Narrative  ? Not on file  ? ?Social Determinants of Health  ? ?Financial Resource Strain: Not on file  ?Food Insecurity: Not on file  ?Transportation Needs: Not on file  ?Physical Activity: Not on file  ?Stress: Not on file  ?Social Connections: Not on file  ?Intimate Partner Violence: Not on file  ?  ?  Current Outpatient Medications on File Prior to Visit  ?Medication Sig Dispense Refill  ? amLODipine (NORVASC) 5 MG tablet Take 5 mg by mouth every morning.    ? Cholecalciferol (VITAMIN D-3) 125 MCG (5000 UT) TABS Take 2 tablets by mouth daily.    ? ibuprofen (ADVIL) 800 MG tablet Take 1 tablet (800 mg total) by mouth every 8 (eight) hours as needed. 30 tablet 0  ? losartan (COZAAR) 100 MG tablet Take 100 mg by mouth every morning.    ? omeprazole (PRILOSEC) 40 MG capsule Take by  mouth.    ? rosuvastatin (CRESTOR) 20 MG tablet Take 20 mg by mouth at bedtime.    ? tamoxifen (NOLVADEX) 20 MG tablet Take 1 tablet by mouth once daily 90 tablet 1  ? ?No current facility-administered medications on file prior to visit.  ?  ?No Known Allergies  ? ?  ?Observations/Objective: ?Today's Vitals  ? 02/15/22 1303  ?PainSc: 0-No pain  ? ?There is no height or weight on file to calculate BMI.  ?Physical Exam ?Neurological:  ?   Mental Status: She is alert.  ? ? ?CBC ?   ?Component Value Date/Time  ? WBC 4.9 02/12/2022 0756  ? RBC 3.49 (L) 02/12/2022 0756  ? HGB 10.2 (L) 02/12/2022 0756  ? HGB 8.5 (L) 02/09/2021 0844  ? HCT 31.1 (L) 02/12/2022 0756  ? HCT 28.4 (L) 02/09/2021 0844  ? PLT 306 02/12/2022 0756  ? MCV 89.1 02/12/2022 0756  ? MCV 72 (L) 02/09/2021 0844  ? MCH 29.2 02/12/2022 0756  ? MCHC 32.8 02/12/2022 0756  ? RDW 14.0 02/12/2022 0756  ? RDW 17.2 (H) 02/09/2021 0844  ? LYMPHSABS 1.0 02/12/2022 0756  ? LYMPHSABS 1.7 02/09/2021 0844  ? MONOABS 0.4 02/12/2022 0756  ? EOSABS 0.0 02/12/2022 0756  ? EOSABS 0.1 02/09/2021 0844  ? BASOSABS 0.0 02/12/2022 0756  ? BASOSABS 0.0 02/09/2021 0844  ?  ?CMP  ?   ?Component Value Date/Time  ? NA 134 (L) 02/12/2022 0756  ? NA 137 02/09/2021 0844  ? K 3.7 02/12/2022 0756  ? CL 105 02/12/2022 0756  ? CO2 23 02/12/2022 0756  ? GLUCOSE 105 (H) 02/12/2022 0756  ? BUN 11 02/12/2022 0756  ? BUN 5 (L) 02/09/2021 0844  ? CREATININE 0.71 02/12/2022 0756  ? CALCIUM 8.8 (L) 02/12/2022 0756  ? PROT 7.5 02/12/2022 0756  ? ALBUMIN 3.6 02/12/2022 0756  ? AST 17 02/12/2022 0756  ? ALT 17 02/12/2022 0756  ? ALKPHOS 30 (L) 02/12/2022 0756  ? BILITOT 0.3 02/12/2022 0756  ? GFRNONAA >60 02/12/2022 0756  ? GFRAA >60 10/16/2017 1557  ?  ? ?Assessment and Plan: ?1. Malignant neoplasm of upper-outer quadrant of left breast in female, estrogen receptor positive ([place])   ?2. Other iron deficiency anemia   ?3. Long-term current use of tamoxifen   ?  ? ?# Left breast pT1c pN0 breast cancer,  ER/PR+, HER2/neu negative - 04/10/21  ?S/p left lumpectomy/SLNB- adjuvant RT-Oncotype Dx 8. - Aug 2022 Tamoxifen.  ?Labs are reviewed and discussed with patient. ?Continue Tamoxifen.  ?Recommend annual pelvic examination by gynecology. ?Gyn has scheduled her annual mammogram.  ? ?#Iron deficiency anemia ?S/p IV venofer. Hemoglobin and iron panel have both improved.  ?Recommend one more dose of IV venofer 221m.  ? ?Follow up in 6 months.  ? ?I discussed the assessment and treatment plan with the patient. The patient was provided an opportunity to ask questions and all were answered. The patient  agreed with the plan and demonstrated an understanding of the instructions.  ?The patient was advised to call back or seek an in-person evaluation if the symptoms worsen or if the condition fails to improve as anticipated.  ? ?Earlie Server, MD 02/15/2022 10:11 PM  ? ?

## 2022-02-15 NOTE — Progress Notes (Signed)
RN called and verified pt name and date of birth.  Pt verbalized understanding of mychart appt today.  Pt denies any concerns today.  ?

## 2022-02-15 NOTE — Telephone Encounter (Signed)
Patient would like to make today's appointment virtual and reschedule infusion appointment. ?

## 2022-02-17 ENCOUNTER — Inpatient Hospital Stay: Payer: BC Managed Care – PPO

## 2022-02-19 ENCOUNTER — Other Ambulatory Visit: Payer: Self-pay | Admitting: Oncology

## 2022-02-23 ENCOUNTER — Encounter: Payer: Self-pay | Admitting: Oncology

## 2022-02-24 ENCOUNTER — Encounter: Payer: Self-pay | Admitting: Oncology

## 2022-02-24 NOTE — Telephone Encounter (Signed)
Any advice/ thoughts on pt's concern regarding alkaline water?  ?

## 2022-02-25 ENCOUNTER — Other Ambulatory Visit: Payer: Self-pay

## 2022-02-25 ENCOUNTER — Inpatient Hospital Stay: Payer: BC Managed Care – PPO

## 2022-02-25 VITALS — BP 126/68 | HR 80 | Temp 97.8°F

## 2022-02-25 DIAGNOSIS — C50412 Malignant neoplasm of upper-outer quadrant of left female breast: Secondary | ICD-10-CM | POA: Diagnosis not present

## 2022-02-25 MED ORDER — SODIUM CHLORIDE 0.9 % IV SOLN
Freq: Once | INTRAVENOUS | Status: AC
  Filled 2022-02-25: qty 250

## 2022-02-25 MED ORDER — IRON SUCROSE 20 MG/ML IV SOLN
200.0000 mg | Freq: Once | INTRAVENOUS | Status: AC
  Administered 2022-02-25: 200 mg via INTRAVENOUS
  Filled 2022-02-25: qty 10

## 2022-02-25 MED ORDER — SODIUM CHLORIDE 0.9 % IV SOLN: 200.0000 mg | Freq: Once | INTRAVENOUS | Status: DC

## 2022-02-25 NOTE — Patient Instructions (Signed)

## 2022-02-26 ENCOUNTER — Ambulatory Visit: Payer: BC Managed Care – PPO

## 2022-03-16 ENCOUNTER — Inpatient Hospital Stay: Admission: RE | Admit: 2022-03-16 | Payer: BC Managed Care – PPO | Source: Ambulatory Visit

## 2022-03-16 ENCOUNTER — Other Ambulatory Visit: Payer: BC Managed Care – PPO

## 2022-03-23 ENCOUNTER — Ambulatory Visit: Payer: BC Managed Care – PPO | Admitting: Surgery

## 2022-05-20 ENCOUNTER — Other Ambulatory Visit: Payer: Self-pay | Admitting: Oncology

## 2022-07-19 ENCOUNTER — Ambulatory Visit: Payer: BC Managed Care – PPO | Admitting: Radiation Oncology

## 2022-08-03 IMAGING — MG US BREAST BX W LOC DEV 1ST LESION IMG BX SPEC US GUIDE*L*
1 series · 8 of 8 positions shown · non-contrast
Comparison: Previous exam(s).
COMPARISON: Previous exam(s).

Addendum:
CLINICAL DATA: Bilateral breast ultrasound guided biopsy for
BI-RADS 3 masses

EXAM:
ULTRASOUND GUIDED RIGHT BREAST CORE NEEDLE BIOPSY
ULTRASOUND GUIDED LEFT BREAST CORE NEEDLE BIOPSY

[Series 1: MG view · 0.07mm/px · 8 of 13 slices shown]
[im 1/13]
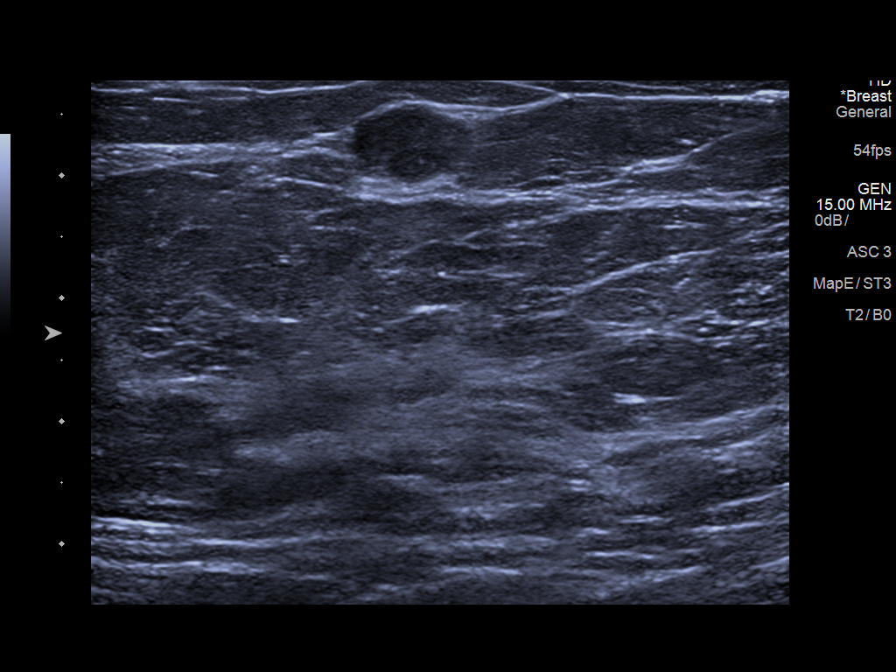
[im 2/13]
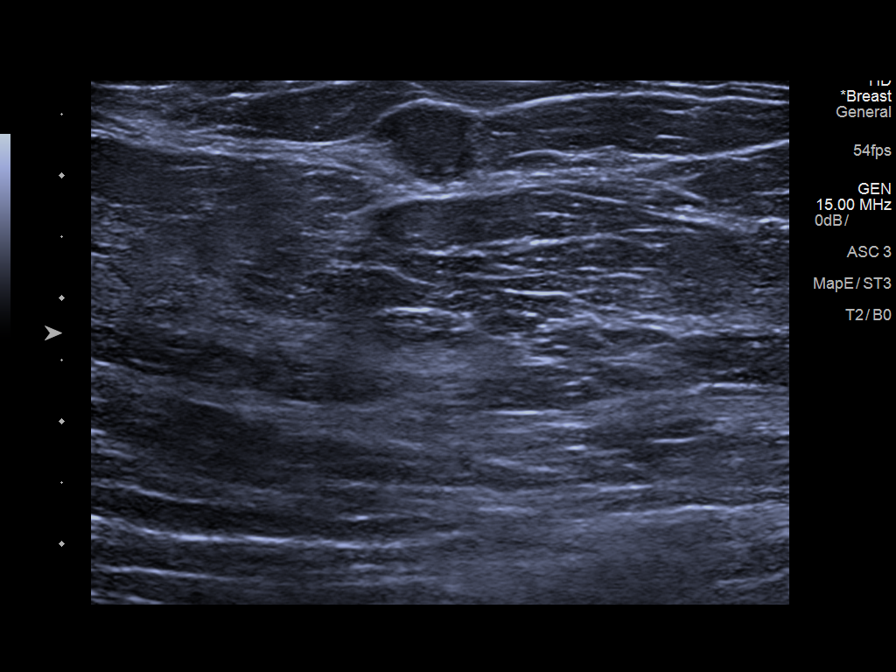
[im 4/13]
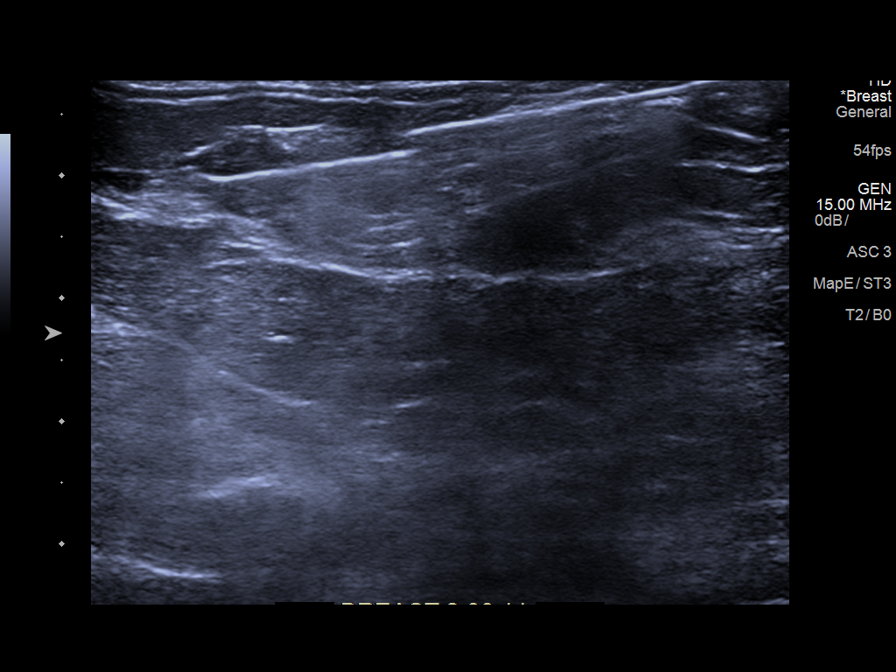
[im 6/13]
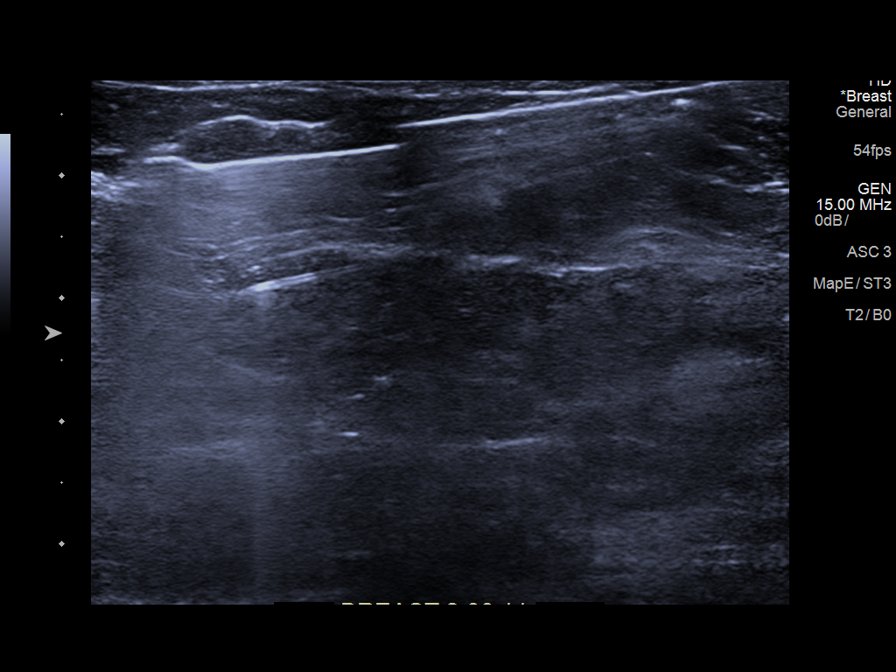
[im 7/13]
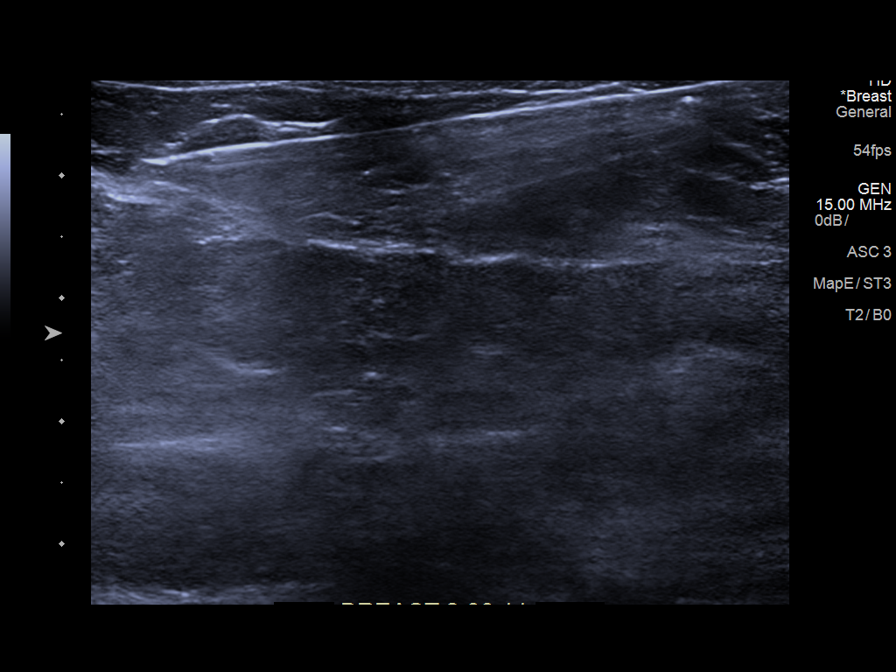
[im 9/13]
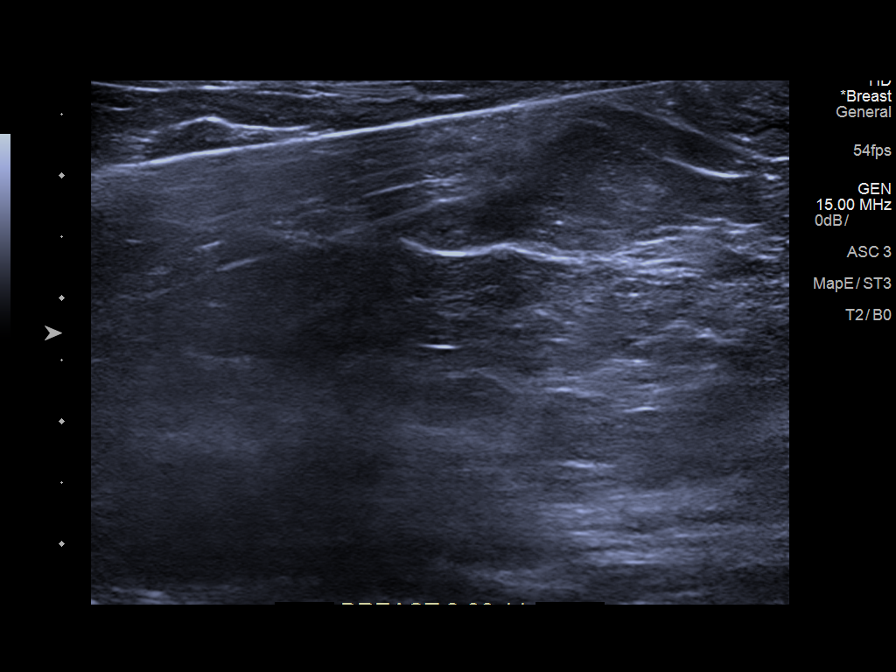
[im 11/13]
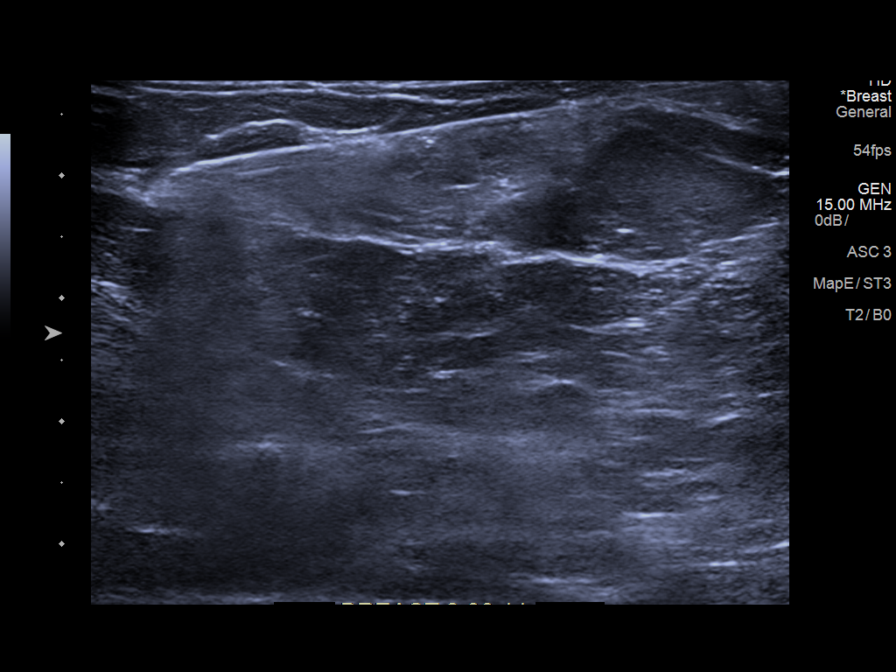
[im 13/13]
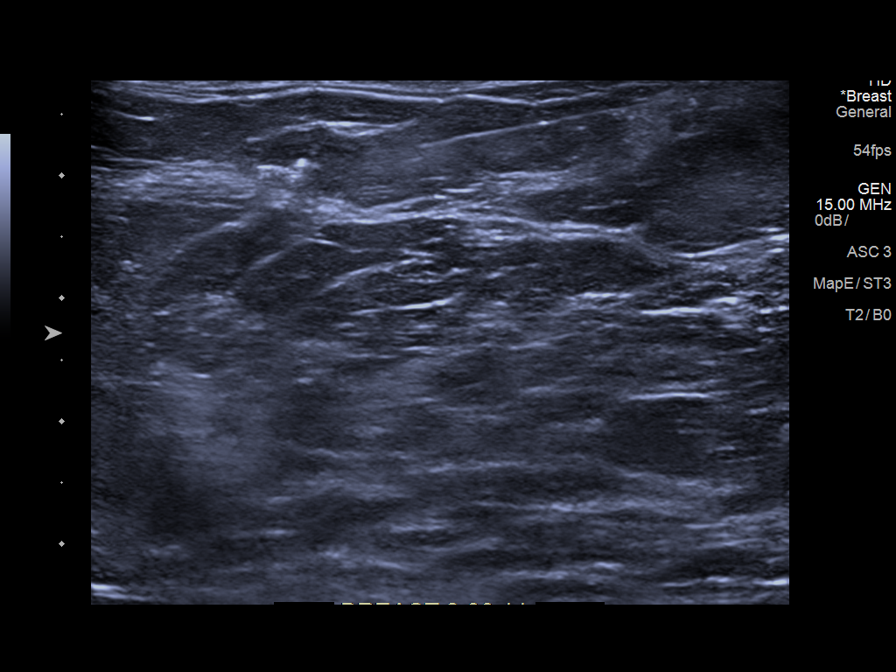

[8 of 8 positions shown; findings below may reference images not displayed]



Site 1: RIGHT breast, [DATE]

Lesion quadrant: Upper outer quadrant

Using sterile technique and 1% lidocaine and 1% lidocaine with
epinephrine as local anesthetic, under direct ultrasound
visualization, a 14 gauge Klpigbb device was used to perform
biopsy of a mass in the outer breast using an inferior approach. At
the conclusion of the procedure an X tissue marker clip was deployed
into the biopsy cavity. Follow up 2 view mammogram was performed and
dictated separately.

Site 2: LEFT breast 2 o'clock

Lesion quadrant: Upper outer quadrant

Using sterile technique and 1% lidocaine and 1% lidocaine with
epinephrine as local anesthetic, under direct ultrasound
visualization, a 14 gauge Klpigbb device was used to perform
biopsy of a mass in the outer breast using an inferior approach. At
the conclusion of the procedure a Q tissue marker clip was deployed
into the biopsy cavity. Follow up 2 view mammogram was performed and
dictated separately.
IMPRESSION: Ultrasound guided biopsy of a RIGHT breast mass at 9 o'clock and a
LEFT breast mass at 2 o'clock. No apparent complications.

ADDENDUM:
PATHOLOGY revealed: Site A. BREAST, LEFT AT [DATE]; ULTRASOUND-GUIDED
CORE NEEDLE BIOPSY: - INVASIVE MAMMARY CARCINOMA, WITH MUCINOUS
FEATURES. Size of invasive carcinoma: At least 4 mm in this sample.
Histologic grade of invasive carcinoma: Grade 2. Ductal carcinoma in
situ: Not identified. Lymphovascular invasion: Not identified.

Pathology results are CONCORDANT with imaging findings, per Dr.
Ai Hua Atoh.

PATHOLOGY revealed: Site B. BREAST, RIGHT AT [DATE]; ULTRASOUND-GUIDED
CORE NEEDLE BIOPSY: - FRAGMENTS OF BENIGN FIBROADENOMA. - NEGATIVE
FOR ATYPICAL PROLIFERATIVE BREAST DISEASE.

Pathology results are CONCORDANT with imaging findings, per Dr.
Ai Hua Atoh.

Pathology results and recommendations below were discussed with
patient by telephone on 03/16/2021. Patient reported biopsy site
within normal limits with slight tenderness at the site. Post biopsy
care instructions were reviewed, questions were answered and my
direct phone number was provided to patient. Patient was instructed
to call [HOSPITAL] if any concerns or questions arise
related to the biopsy.

Recommendations:

1. Surgical consultation for site A only: Request for surgical
consultation relayed to Latoya Troiano RN and Lesya Aujla RN at
[HOSPITAL] [HOSPITAL] by Raajii Abdii Bhokolaa RN on 03/16/2021.

2. Consider bilateral breast MRI given patient's age and increased
lifetime risk of breast cancer due to family history.

Pathology results reported by Raajii Abdii Bhokolaa RN on 03/16/2021.



Site 1: RIGHT breast, [DATE]

Lesion quadrant: Upper outer quadrant

Using sterile technique and 1% lidocaine and 1% lidocaine with
epinephrine as local anesthetic, under direct ultrasound
visualization, a 14 gauge Klpigbb device was used to perform
biopsy of a mass in the outer breast using an inferior approach. At
the conclusion of the procedure an X tissue marker clip was deployed
into the biopsy cavity. Follow up 2 view mammogram was performed and
dictated separately.

Site 2: LEFT breast 2 o'clock

Lesion quadrant: Upper outer quadrant

Using sterile technique and 1% lidocaine and 1% lidocaine with
epinephrine as local anesthetic, under direct ultrasound
visualization, a 14 gauge Klpigbb device was used to perform
biopsy of a mass in the outer breast using an inferior approach. At
the conclusion of the procedure a Q tissue marker clip was deployed
into the biopsy cavity. Follow up 2 view mammogram was performed and
dictated separately.
IMPRESSION: Ultrasound guided biopsy of a RIGHT breast mass at 9 o'clock and a
LEFT breast mass at 2 o'clock. No apparent complications.

## 2022-08-03 IMAGING — MG MM BREAST LOCALIZATION CLIP
4 series · 4 of 12 positions shown · non-contrast
Comparison: Previous exam(s).

CLINICAL DATA: Status post ultrasound-guided biopsy

EXAM:
DIAGNOSTIC LEFT MAMMOGRAM POST ULTRASOUND BIOPSY

[L CC synth-2D]
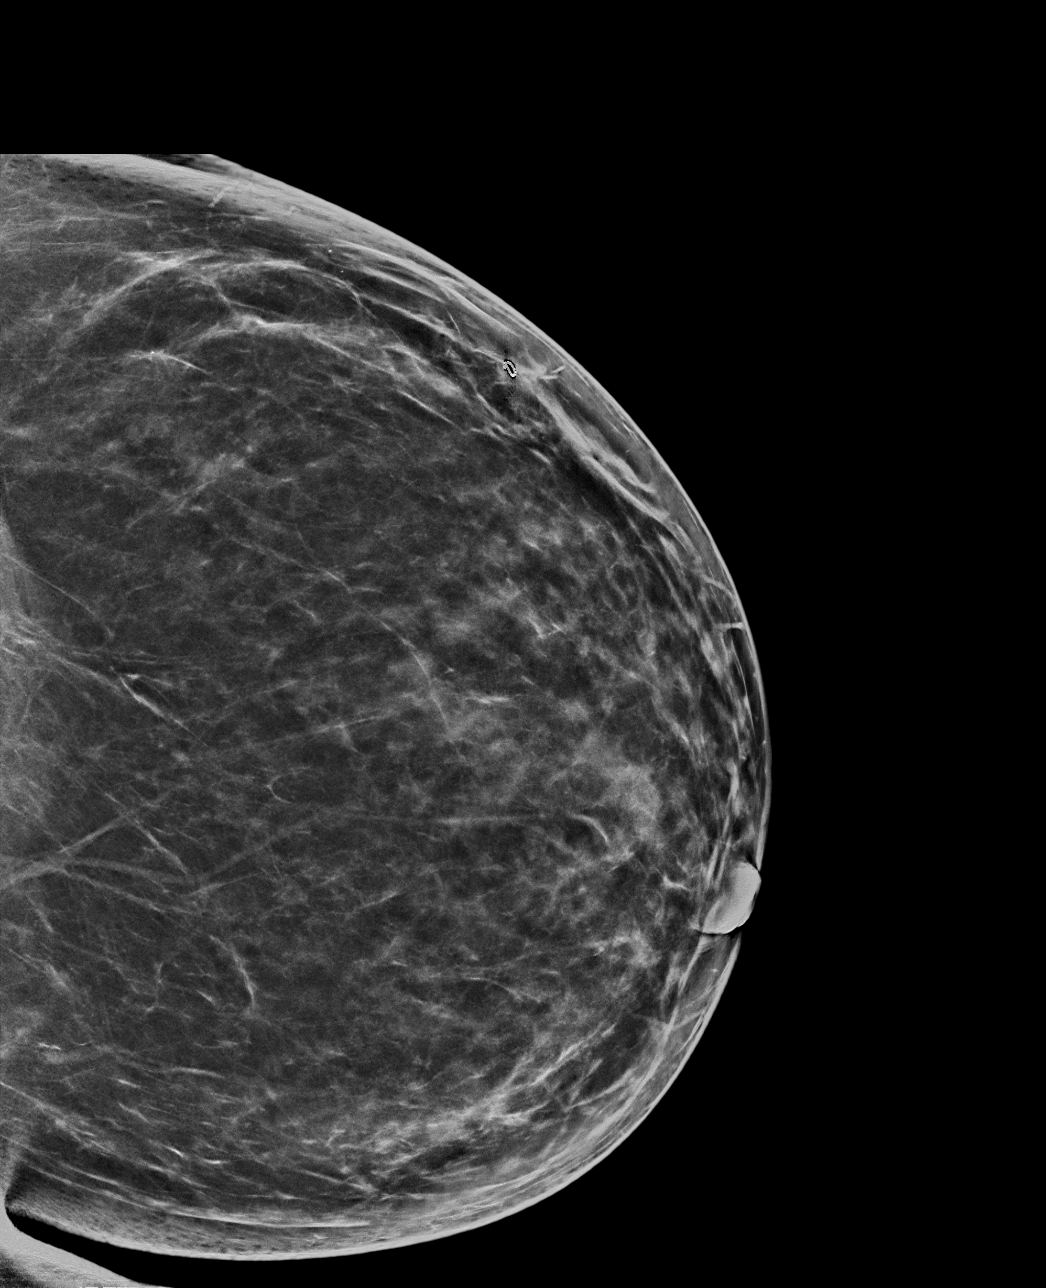

[L ML synth-2D]
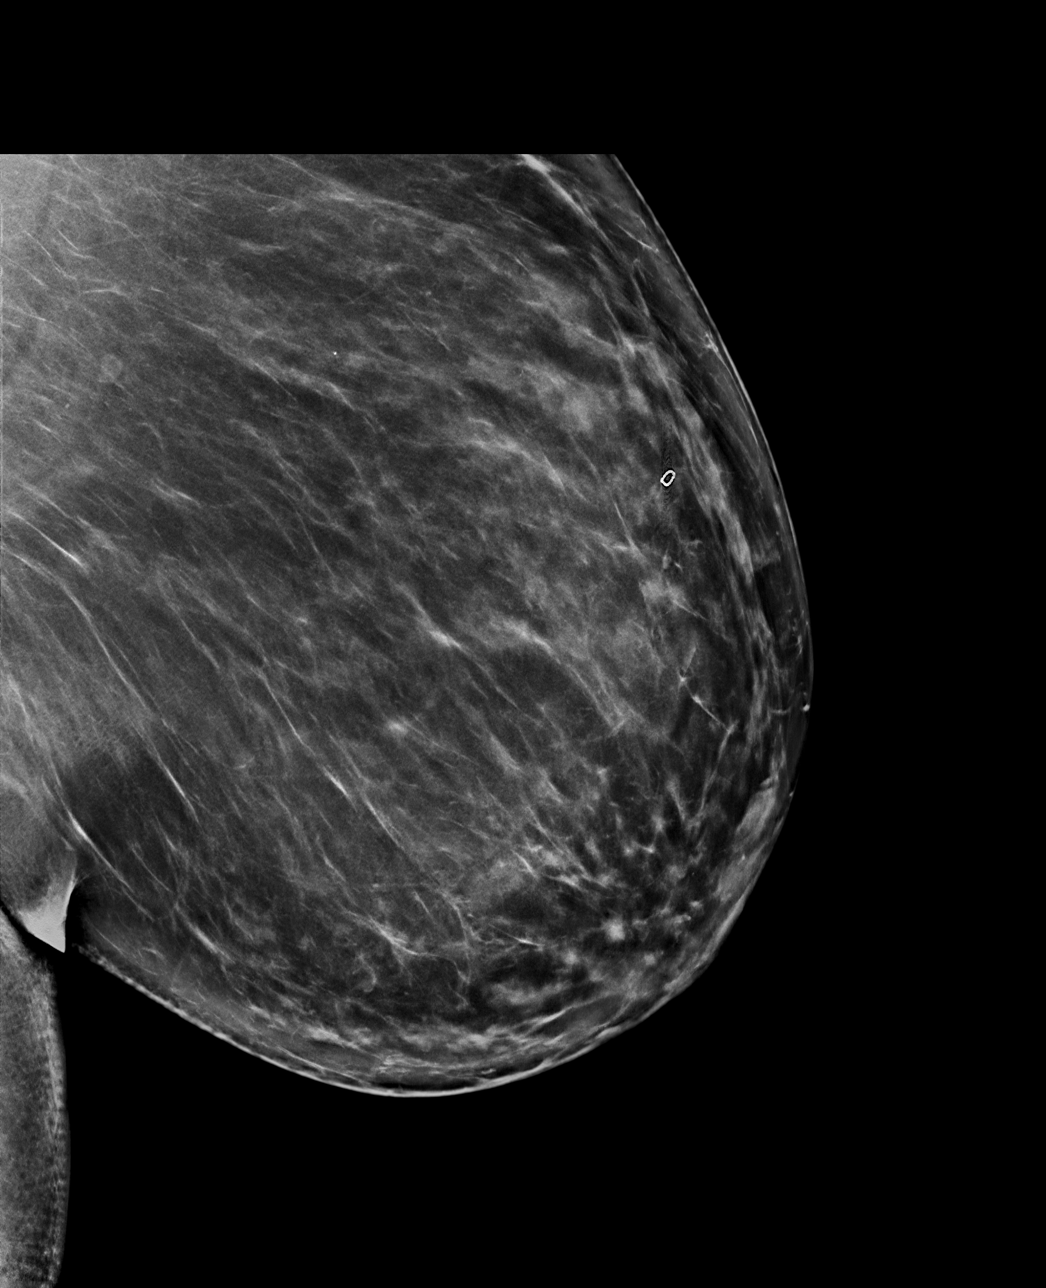

[L CC tomo · tomo slice 45/90.0]
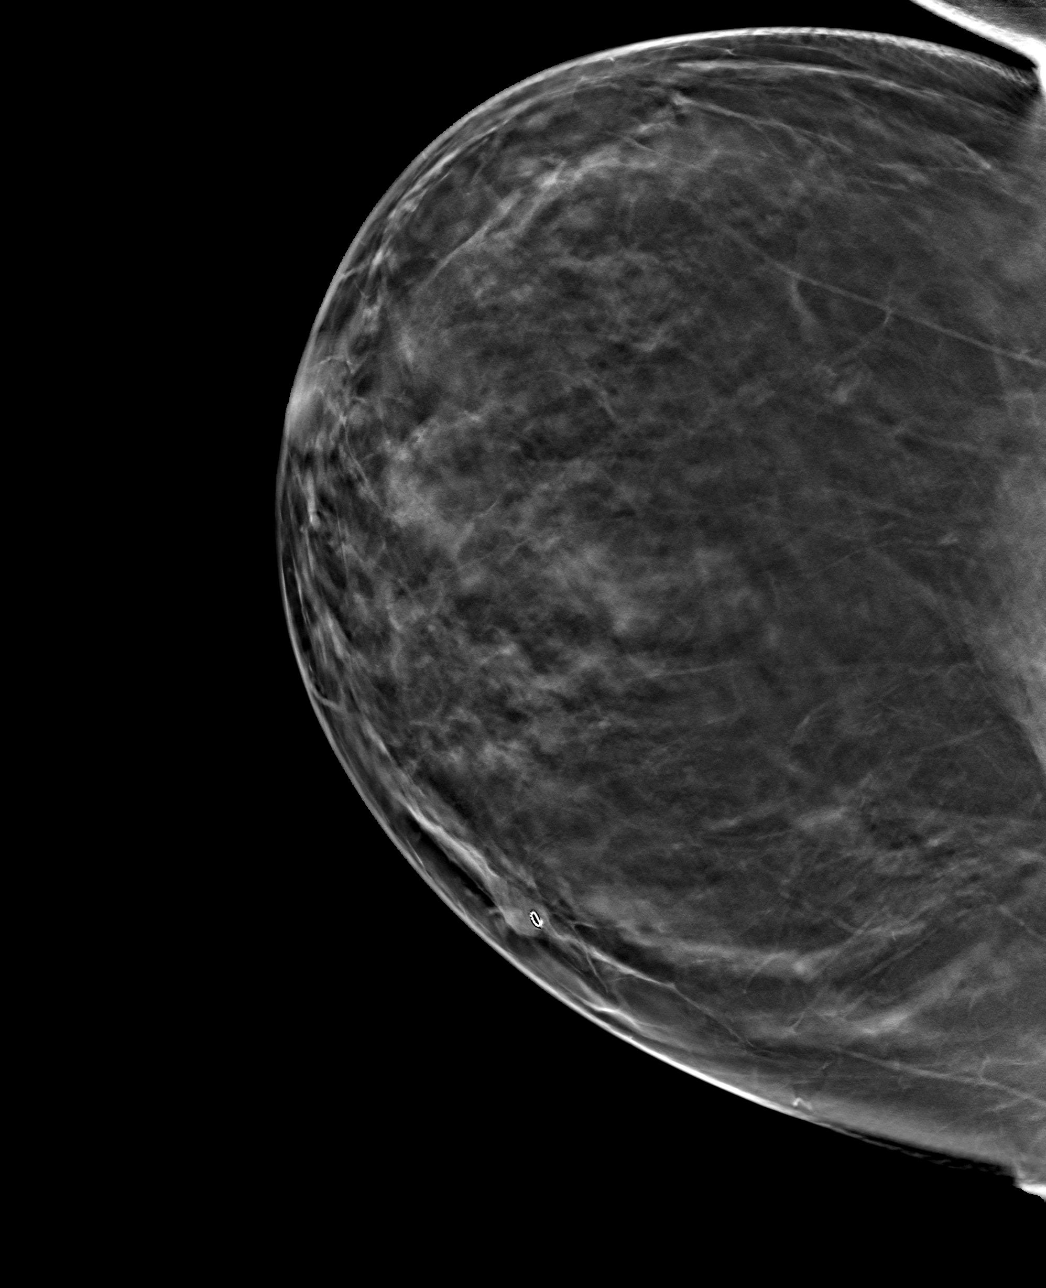

[L ML tomo · tomo slice 51/101.0]
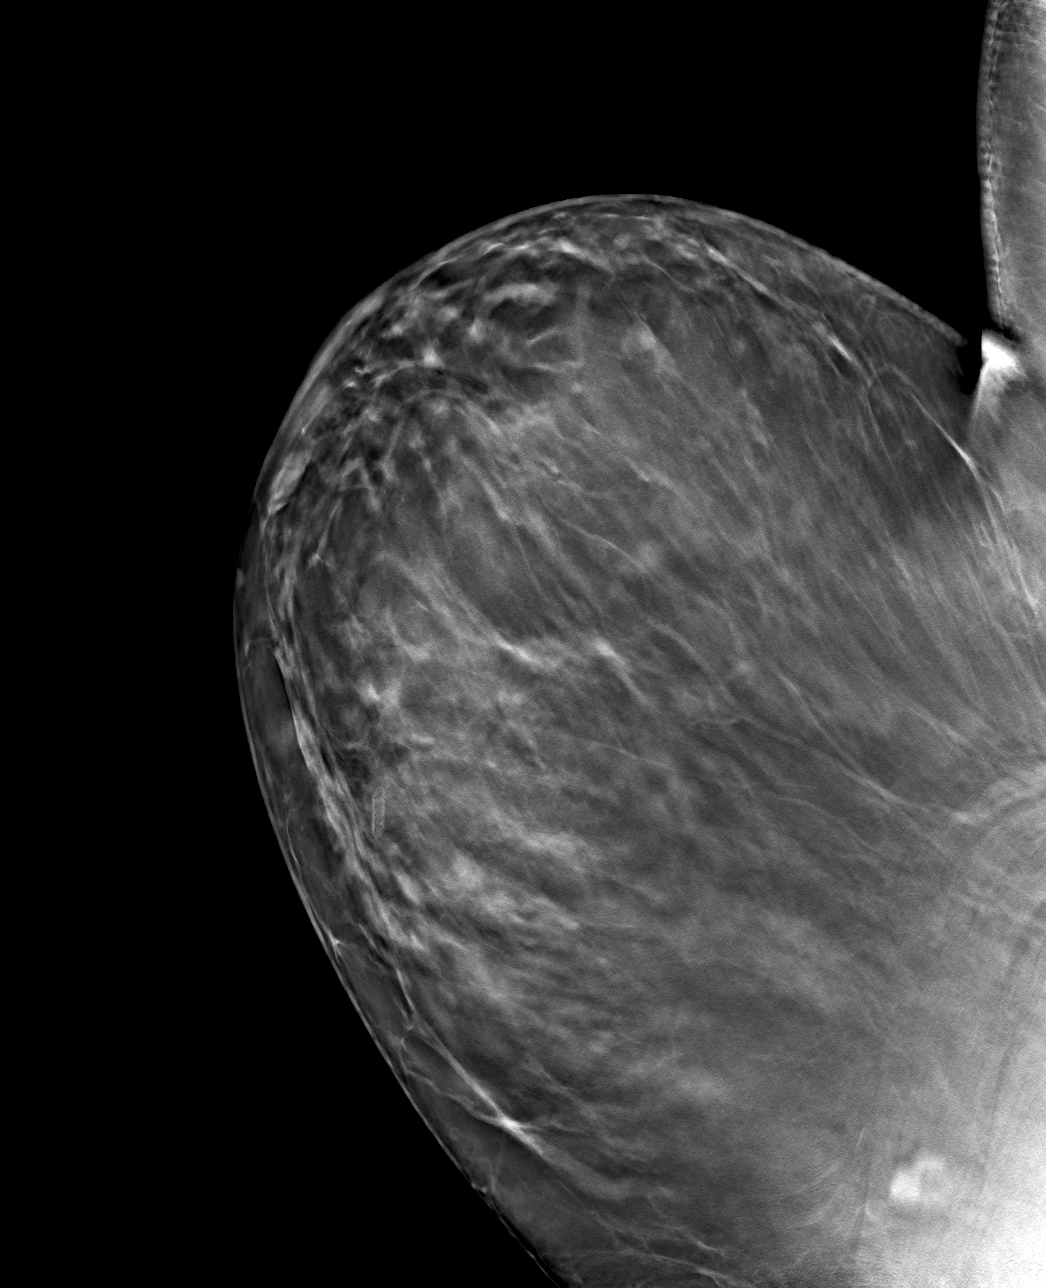

[4 of 12 positions shown; findings below may reference images not displayed]

FINDINGS: Mammographic images were obtained following ultrasound guided biopsy
of a LEFT breast mass. The Q biopsy marking clip is in expected
position at the site of biopsy.
IMPRESSION: Appropriate positioning of the Q shaped biopsy marking clip at the
site of biopsy in the LEFT upper outer breast.

Final Assessment: Post Procedure Mammograms for Marker Placement

## 2022-08-13 ENCOUNTER — Other Ambulatory Visit: Payer: Self-pay | Admitting: Oncology

## 2022-08-23 ENCOUNTER — Inpatient Hospital Stay: Payer: BC Managed Care – PPO | Attending: Oncology

## 2022-08-23 DIAGNOSIS — C50912 Malignant neoplasm of unspecified site of left female breast: Secondary | ICD-10-CM | POA: Insufficient documentation

## 2022-08-23 DIAGNOSIS — Z170 Estrogen receptor positive status [ER+]: Secondary | ICD-10-CM | POA: Insufficient documentation

## 2022-08-23 DIAGNOSIS — D509 Iron deficiency anemia, unspecified: Secondary | ICD-10-CM | POA: Diagnosis not present

## 2022-08-23 LAB — CBC WITH DIFFERENTIAL/PLATELET
Abs Immature Granulocytes: 0.03 10*3/uL (ref 0.00–0.07)
Basophils Absolute: 0 10*3/uL (ref 0.0–0.1)
Basophils Relative: 0 %
Eosinophils Absolute: 0.1 10*3/uL (ref 0.0–0.5)
Eosinophils Relative: 2 %
HCT: 32.2 % — ABNORMAL LOW (ref 36.0–46.0)
Hemoglobin: 10.5 g/dL — ABNORMAL LOW (ref 12.0–15.0)
Immature Granulocytes: 1 %
Lymphocytes Relative: 24 %
Lymphs Abs: 1.1 10*3/uL (ref 0.7–4.0)
MCH: 29.2 pg (ref 26.0–34.0)
MCHC: 32.6 g/dL (ref 30.0–36.0)
MCV: 89.4 fL (ref 80.0–100.0)
Monocytes Absolute: 0.4 10*3/uL (ref 0.1–1.0)
Monocytes Relative: 9 %
Neutro Abs: 3 10*3/uL (ref 1.7–7.7)
Neutrophils Relative %: 64 %
Platelets: 296 10*3/uL (ref 150–400)
RBC: 3.6 MIL/uL — ABNORMAL LOW (ref 3.87–5.11)
RDW: 12.8 % (ref 11.5–15.5)
WBC: 4.6 10*3/uL (ref 4.0–10.5)
nRBC: 0 % (ref 0.0–0.2)

## 2022-08-23 LAB — IRON AND TIBC
Iron: 46 ug/dL (ref 28–170)
Saturation Ratios: 14 % (ref 10.4–31.8)
TIBC: 336 ug/dL (ref 250–450)
UIBC: 290 ug/dL

## 2022-08-23 LAB — FERRITIN: Ferritin: 98 ng/mL (ref 11–307)

## 2022-08-25 ENCOUNTER — Ambulatory Visit: Payer: BC Managed Care – PPO | Admitting: Oncology

## 2022-08-25 ENCOUNTER — Ambulatory Visit: Payer: BC Managed Care – PPO

## 2022-08-25 MED FILL — Iron Sucrose Inj 20 MG/ML (Fe Equiv): INTRAVENOUS | Qty: 10 | Status: AC

## 2022-08-26 ENCOUNTER — Inpatient Hospital Stay: Payer: BC Managed Care – PPO

## 2022-08-26 ENCOUNTER — Encounter: Payer: Self-pay | Admitting: Oncology

## 2022-08-26 ENCOUNTER — Inpatient Hospital Stay (HOSPITAL_BASED_OUTPATIENT_CLINIC_OR_DEPARTMENT_OTHER): Payer: BC Managed Care – PPO | Admitting: Oncology

## 2022-08-26 VITALS — BP 159/97 | HR 88 | Temp 97.8°F | Resp 20 | Wt 248.1 lb

## 2022-08-26 DIAGNOSIS — D508 Other iron deficiency anemias: Secondary | ICD-10-CM

## 2022-08-26 DIAGNOSIS — C50912 Malignant neoplasm of unspecified site of left female breast: Secondary | ICD-10-CM

## 2022-08-26 DIAGNOSIS — Z170 Estrogen receptor positive status [ER+]: Secondary | ICD-10-CM | POA: Diagnosis not present

## 2022-08-26 DIAGNOSIS — Z803 Family history of malignant neoplasm of breast: Secondary | ICD-10-CM

## 2022-08-26 NOTE — Progress Notes (Signed)
Hematology/Oncology Progress note [phone] [phone]      Patient Care Team: Danelle Berry, NP as PCP - General (Nurse Practitioner) Rico Junker, RN as Oncology Nurse Navigator Noreene Filbert, MD as Referring Physician (Radiation Oncology) Earlie Server, MD as Consulting Physician (Oncology) Ronny Bacon, MD as Consulting Physician (General Surgery)  ASSESSMENT & PLAN:   Cancer Staging  Breast cancer, stage 1, estrogen receptor positive, left [facility]) Staging form: Breast, AJCC 8th Edition - Pathologic stage from 04/10/2021: Stage IA (pT1c, pN0, cM0, G2, ER+, PR+, HER2-, Oncotype DX score: 8) - Signed by Earlie Server, MD on 08/26/2022   Breast cancer, stage 1, estrogen receptor positive, left ([employer]) #Left breast pT1c pN0 breast cancer, ER/PR+, HER2/neu negative - 04/10/21  S/p left lumpectomy/SLNB- adjuvant RT Oncotype Dx 8.  Labs are reviewed and discussed with patient. Continue Tamoxifen 12m daily.  Recommend annual gyn follow up for pelvic exam Bilateral diagnostic mammogram  Family history of breast cancer I have previously referred patient to establish care with genetic counselor.  IDA (iron deficiency anemia) Labs are reviewed and discussed with patient. Borderline iron saturation 14, Hb 10.5 Recommend IV Venofer 2022mx 1.  Check hemoglobinopathy eval  Orders Placed This Encounter  Procedures   CBC with Differential/Platelet    Standing Status:   Future    Standing Expiration Date:   08/27/2023   Iron and TIBC([facility])    Standing Status:   Future    Standing Expiration Date:   08/27/2023   Ferritin    Standing Status:   Future    Standing Expiration Date:   08/27/2023   Comprehensive metabolic panel    Standing Status:   Future    Standing Expiration Date:   08/26/2023   Cancer antigen 15-3    Standing Status:   Future    Standing Expiration Date:   08/27/2023   Vitamin B12    Standing Status:   Future    Standing  Expiration Date:   08/27/2023   Folate    Standing Status:   Future    Standing Expiration Date:   08/27/2023   6 months follow up All questions were answered. The patient knows to call the clinic with any problems, questions or concerns.  ZhEarlie ServerMD, PhD [facility] 08/26/2022   CHIEF COMPLAINTS/REASON FOR VISIT:   Follow up for left breast cancer.   HISTORY OF PRESENTING ILLNESS:   Amanda Pearson a  [age].o.  female with PMH listed below was seen in consultation at the request of  BoDanelle BerryNP  for evaluation of left breast cancer Patient was seen recently by gynecology for physical. There was a mass palpated in the left breast.  Patient was recommended to proceed with diagnostic mammogram 03/09/2021, bilateral diagnostic mammogram and ultrasound showed Left breast 0.9 x 0.6 x 1.1 cm slight lobulated hypoechoic mass at 2:00 11 cm from the nipple.  Ultrasound of the left axillary is negative Right breast 1 x 0.5x 0.7 cm 9:00 15 cm from nipple Patient was recommended for 6-84-monthllow-up for needle biopsy.  Patient opted   have biopsy of both mass.  03/13/2021, patient went biopsy of both breast masses. 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  Grade 2 ER/PR/HER-2 status is pending.  Patient was referred to establish care with oncology.  She also has an appointment with Dr. RosLutricia Feilxt week Patient was accompanied by her  husband. She is very anxious and feels overwhelmed after receiving the news. Denies any breast pain, nipple discharge. Menarche  -50 to 41 years old She has no children. History of OCP use for about a year.  no hormone replacement therapy  LMP March 2022 Denies any previous biopsies Family history positive for paternal aunt was diagnosed with cancer in late 28s-early  50s.                    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 pT1c pN0      #05/26/2021- 07/03/2021 Adjuvant breast radiation.                                                     INTERVAL HISTORY Amanda Pearson is a 41 y.o. female who has above history reviewed by me today presents for follow up visit for management of stage I ER/PR positive, HER2 negative left breast cancer.  Patient tolerates Tamoxifen 62m daily, manageable side effects.   Review of Systems  Constitutional:  Negative for appetite change, chills, fatigue and fever.  HENT:   Negative for hearing loss and voice change.   Eyes:  Negative for eye problems.  Respiratory:  Negative for chest tightness and cough.   Cardiovascular:  Negative for chest pain.  Gastrointestinal:  Negative for abdominal distention, abdominal pain and blood in stool.  Endocrine: Positive for hot flashes.  Genitourinary:  Negative for difficulty urinating and frequency.   Musculoskeletal:  Positive for arthralgias.  Skin:  Negative for itching and rash.  Neurological:  Negative for extremity weakness.  Hematological:  Negative for adenopathy.  Psychiatric/Behavioral:  Negative for confusion. The patient is not nervous/anxious.    Left breast and axillary soreness.   MEDICAL HISTORY:  Past Medical History:  Diagnosis Date   Anemia    Anemia    Anxiety    Cancer (HCC)    Hypertension    IDA (iron deficiency anemia) 10/28/2021    SURGICAL HISTORY: Past Surgical History:  Procedure Laterality Date   BREAST BIOPSY Left 03/13/2021   [place], q-clip, IMC with mucinous features   BREAST BIOPSY Right 03/13/2021   [place], x-clip, fibroadenoma   BREAST LUMPECTOMY,RADIO FREQ LOCALIZER,AXILLARY SENTINEL LYMPH NODE BIOPSY Left 04/10/2021   Procedure: BREAST LUMPECTOMY,RADIO FREQ LOCALIZER,AXILLARY SENTINEL LYMPH NODE BIOPSY;  Surgeon: RRonny Bacon MD;  Location: [facility] ORS;  Service: General;   Laterality: Left;   FACIAL COSMETIC SURGERY      SOCIAL HISTORY: Social History   Socioeconomic History   Marital status: Married    Spouse name: Fredderick   Number of children: Not on file   Years of education: Not on file   Highest education level: Not on file  Occupational History   Not on file  Tobacco Use   Smoking status: Never   Smokeless tobacco: Never  Vaping Use   Vaping Use: Never used  Substance and Sexual Activity   Alcohol use: No   Drug use: No   Sexual activity: Yes  Other Topics Concern   Not on file  Social History Narrative   Not on file   Social Determinants of Health   Financial Resource Strain: Not on file  Food Insecurity: Not on file  Transportation Needs: Not on file  Physical Activity: Not on file  Stress: Not on file  Social Connections: Not on file  Intimate Partner Violence: Not on file    FAMILY HISTORY: Family History  Problem Relation Age of Onset   COPD Mother    Kidney disease Mother    Multiple sclerosis Father    Breast cancer Paternal Aunt 26    ALLERGIES:  has No Known Allergies.  MEDICATIONS:  Current Outpatient Medications  Medication Sig Dispense Refill   amLODipine (NORVASC) 5 MG tablet Take 5 mg by mouth every morning.     Cholecalciferol (VITAMIN D-3) 125 MCG (5000 UT) TABS Take 2 tablets by mouth daily.     ibuprofen (ADVIL) 800 MG tablet Take 1 tablet (800 mg total) by mouth every 8 (eight) hours as needed. 30 tablet 0   losartan (COZAAR) 100 MG tablet Take 100 mg by mouth every morning.     omeprazole (PRILOSEC) 40 MG capsule Take by mouth.     rosuvastatin (CRESTOR) 20 MG tablet Take 20 mg by mouth at bedtime.     tamoxifen (NOLVADEX) 20 MG tablet Take 1 tablet by mouth once daily 90 tablet 0   No current facility-administered medications for this visit.     PHYSICAL EXAMINATION: ECOG PERFORMANCE STATUS: 0 - Asymptomatic Vitals:   08/26/22 1418  BP: (!) 159/97  Pulse: 88  Resp: 20  Temp: 97.8 F  (36.6 C)  SpO2: 100%   Filed Weights   08/26/22 1418  Weight: 248 lb 1.6 oz (112.5 kg)    Physical Exam Constitutional:      General: She is not in acute distress. HENT:     Head: Normocephalic and atraumatic.  Eyes:     General: No scleral icterus. Cardiovascular:     Rate and Rhythm: Normal rate and regular rhythm.  Pulmonary:     Effort: Pulmonary effort is normal. No respiratory distress.     Breath sounds: No wheezing.  Abdominal:     General: Bowel sounds are normal. There is no distension.     Palpations: Abdomen is soft.  Musculoskeletal:        General: No deformity. Normal range of motion.     Cervical back: Normal range of motion and neck supple.  Skin:    General: Skin is warm and dry.  Neurological:     Mental Status: She is alert and oriented to person, place, and time. Mental status is at baseline.  Psychiatric:        Mood and Affect: Mood normal.     LABORATORY DATA:  I have reviewed the data as listed    Latest Ref Rng & Units 08/23/2022    8:15 AM 02/12/2022    7:56 AM 10/28/2021    2:02 PM  CBC  WBC 4.0 - 10.5 K/uL 4.6  4.9  6.3   Hemoglobin 12.0 - 15.0 g/dL 10.5  10.2  9.2   Hematocrit 36.0 - 46.0 % 32.2  31.1  30.2   Platelets 150 - 400 K/uL 296  306  318       Latest Ref Rng & Units 02/12/2022    7:56 AM 09/18/2021    7:59 AM 03/17/2021    3:19 PM  CMP  Glucose 70 - 99 mg/dL 105  112  98   BUN 6 - 20 mg/dL _0 Creatinine 0.44 - 1.00 mg/dL 0.71  0.77  0.63   Sodium 135 - 145  mmol/L 134  135  135   Potassium 3.5 - 5.1 mmol/L 3.7  3.9  3.6   Chloride 98 - 111 mmol/L 105  106  105   CO2 22 - 32 mmol/L _0 Calcium 8.9 - 10.3 mg/dL 8.8  8.8  8.8   Total Protein 6.5 - 8.1 g/dL 7.5  7.9  8.6   Total Bilirubin 0.3 - 1.2 mg/dL 0.3  0.5  0.7   Alkaline Phos 38 - 126 U/L 30  33  43   AST 15 - 41 U/L _1 ALT 0 - 44 U/L _2 Iron/TIBC/Ferritin/ %Sat    Component Value Date/Time   IRON 46 08/23/2022 0815    TIBC 336 08/23/2022 0815   FERRITIN 98 08/23/2022 0815   IRONPCTSAT 14 08/23/2022 0815      RADIOGRAPHIC STUDIES: I have personally reviewed the radiological images as listed and agreed with the findings in the report.  No results found.

## 2022-08-26 NOTE — Assessment & Plan Note (Addendum)
#  Left breast pT1c pN0 breast cancer, ER/PR+, HER2/neu negative - 04/10/21  S/p left lumpectomy/SLNB- adjuvant RT Oncotype Dx 8.  Labs are reviewed and discussed with patient. Continue Tamoxifen 49m daily.  Recommend annual gyn follow up for pelvic exam Bilateral diagnostic mammogram

## 2022-08-26 NOTE — Assessment & Plan Note (Signed)
I have previously referred patient to establish care with genetic counselor.

## 2022-08-26 NOTE — Assessment & Plan Note (Addendum)
Labs are reviewed and discussed with patient. Borderline iron saturation 14, Hb 10.5 Recommend IV Venofer '200mg'$  x 1.  Check hemoglobinopathy eval

## 2022-08-30 ENCOUNTER — Inpatient Hospital Stay: Payer: BC Managed Care – PPO

## 2022-08-30 DIAGNOSIS — D508 Other iron deficiency anemias: Secondary | ICD-10-CM

## 2022-08-30 DIAGNOSIS — C50912 Malignant neoplasm of unspecified site of left female breast: Secondary | ICD-10-CM | POA: Diagnosis not present

## 2022-08-30 MED ORDER — SODIUM CHLORIDE 0.9 % IV SOLN
INTRAVENOUS | Status: DC
  Filled 2022-08-30: qty 250

## 2022-08-30 MED ORDER — SODIUM CHLORIDE 0.9 % IV SOLN
200.0000 mg | Freq: Once | INTRAVENOUS | Status: AC
  Administered 2022-08-30: 200 mg via INTRAVENOUS
  Filled 2022-08-30: qty 200

## 2022-08-30 NOTE — Patient Instructions (Signed)
[facility] CANCER CTR AT [place]  Discharge Instructions: Thank you for choosing [facility] to provide your oncology and hematology care.  If you have a lab appointment with the [facility], please go directly to the [facility] and check in at the registration area.  Wear comfortable clothing and clothing appropriate for easy access to any Portacath or PICC line.   We strive to give you quality time with your provider. You may need to reschedule your appointment if you arrive late (15 or more minutes).  Arriving late affects you and other patients whose appointments are after yours.  Also, if you miss three or more appointments without notifying the office, you may be dismissed from the clinic at the provider's discretion.      For prescription refill requests, have your pharmacy contact our office and allow 72 hours for refills to be completed.    Today you received the following chemotherapy and/or immunotherapy agents Iron Sucrose.      To help prevent nausea and vomiting after your treatment, we encourage you to take your nausea medication as directed.  BELOW ARE SYMPTOMS THAT SHOULD BE REPORTED IMMEDIATELY: *FEVER GREATER THAN 100.4 F (38 C) OR HIGHER *CHILLS OR SWEATING *NAUSEA AND VOMITING THAT IS NOT CONTROLLED WITH YOUR NAUSEA MEDICATION *UNUSUAL SHORTNESS OF BREATH *UNUSUAL BRUISING OR BLEEDING *URINARY PROBLEMS (pain or burning when urinating, or frequent urination) *BOWEL PROBLEMS (unusual diarrhea, constipation, pain near the anus) TENDERNESS IN MOUTH AND THROAT WITH OR WITHOUT PRESENCE OF ULCERS (sore throat, sores in mouth, or a toothache) UNUSUAL RASH, SWELLING OR PAIN  UNUSUAL VAGINAL DISCHARGE OR ITCHING   Items with * indicate a potential emergency and should be followed up as soon as possible or go to the Emergency Department if any problems should occur.  Please show the CHEMOTHERAPY ALERT CARD or IMMUNOTHERAPY ALERT CARD at check-in  to the Emergency Department and triage nurse.  Should you have questions after your visit or need to cancel or reschedule your appointment, please contact [facility] CANCER [place] AT [place]  [phone] and follow the prompts.  Office hours are 8:00 a.m. to 4:30 p.m. Monday - Friday. Please note that voicemails left after 4:00 p.m. may not be returned until the following business day.  We are closed weekends and major holidays. You have access to a nurse at all times for urgent questions. Please call the main number to the clinic [phone] and follow the prompts.  For any non-urgent questions, you may also contact your provider using MyChart. We now offer e-Visits for anyone 61 and older to request care online for non-urgent symptoms. For details visit [email or web address].   Also download the MyChart app! Go to the app store, search "MyChart", open the app, select [facility], and log in with your MyChart username and password.  Masks are optional in the cancer centers. If you would like for your care team to wear a mask while they are taking care of you, please let them know. For doctor visits, patients may have with them one support person who is at least 43 years old. At this time, visitors are not allowed in the infusion area.  Iron Sucrose Injection What is this medication? IRON SUCROSE (EYE ern SOO krose) treats low levels of iron (iron deficiency anemia) in people with kidney disease. Iron is a mineral that plays an important role in making red blood cells, which carry oxygen from your lungs to the rest of your body. This medicine may  be used for other purposes; ask your health care provider or pharmacist if you have questions. COMMON BRAND NAME(S): Venofer What should I tell my care team before I take this medication? They need to know if you have any of these conditions: Anemia not caused by low iron levels Heart disease High levels of iron in the blood Kidney  disease Liver disease An unusual or allergic reaction to iron, other medications, foods, dyes, or preservatives Pregnant or trying to get pregnant Breast-feeding How should I use this medication? This medication is for infusion into a vein. It is given in a hospital or clinic setting. Talk to your care team about the use of this medication in children. While this medication may be prescribed for children as young as 2 years for selected conditions, precautions do apply. Overdosage: If you think you have taken too much of this medicine contact a poison control center or emergency room at once. NOTE: This medicine is only for you. Do not share this medicine with others. What if I miss a dose? It is important not to miss your dose. Call your care team if you are unable to keep an appointment. What may interact with this medication? Do not take this medication with any of the following: Deferoxamine Dimercaprol Other iron products This medication may also interact with the following: Chloramphenicol Deferasirox This list may not describe all possible interactions. Give your health care provider a list of all the medicines, herbs, non-prescription drugs, or dietary supplements you use. Also tell them if you smoke, drink alcohol, or use illegal drugs. Some items may interact with your medicine. What should I watch for while using this medication? Visit your care team regularly. Tell your care team if your symptoms do not start to get better or if they get worse. You may need blood work done while you are taking this medication. You may need to follow a special diet. Talk to your care team. Foods that contain iron include: whole grains/cereals, dried fruits, beans, or peas, leafy green vegetables, and organ meats (liver, kidney). What side effects may I notice from receiving this medication? Side effects that you should report to your care team as soon as possible: Allergic reactions--skin rash,  itching, hives, swelling of the face, lips, tongue, or throat Low blood pressure--dizziness, feeling faint or lightheaded, blurry vision Shortness of breath Side effects that usually do not require medical attention (report to your care team if they continue or are bothersome): Flushing Headache Joint pain Muscle pain Nausea Pain, redness, or irritation at injection site This list may not describe all possible side effects. Call your doctor for medical advice about side effects. You may report side effects to FDA at 1-800-FDA-1088. Where should I keep my medication? This medication is given in a hospital or clinic and will not be stored at home. NOTE: This sheet is a summary. It may not cover all possible information. If you have questions about this medicine, talk to your doctor, pharmacist, or health care provider.  2023 Elsevier/Gold Standard (2008-01-20 00:00:00)

## 2022-09-14 ENCOUNTER — Ambulatory Visit
Admission: RE | Admit: 2022-09-14 | Discharge: 2022-09-14 | Disposition: A | Discharge status: Home / Self Care | Payer: BC Managed Care – PPO | Source: Ambulatory Visit | Attending: Surgery | Admitting: Surgery

## 2022-09-14 DIAGNOSIS — Z170 Estrogen receptor positive status [ER+]: Secondary | ICD-10-CM | POA: Diagnosis present

## 2022-09-14 DIAGNOSIS — C50912 Malignant neoplasm of unspecified site of left female breast: Secondary | ICD-10-CM | POA: Diagnosis present

## 2022-09-21 ENCOUNTER — Ambulatory Visit: Payer: BC Managed Care – PPO | Admitting: Surgery

## 2022-09-23 ENCOUNTER — Encounter: Payer: Self-pay | Admitting: Surgery

## 2022-09-23 ENCOUNTER — Telehealth: Payer: Self-pay | Admitting: Licensed Clinical Social Worker

## 2022-09-23 ENCOUNTER — Ambulatory Visit: Payer: BC Managed Care – PPO | Admitting: Surgery

## 2022-09-23 VITALS — BP 125/85 | HR 89 | Temp 98.0°F | Ht 63.0 in | Wt 243.0 lb

## 2022-09-23 DIAGNOSIS — Z853 Personal history of malignant neoplasm of breast: Secondary | ICD-10-CM | POA: Diagnosis not present

## 2022-09-23 DIAGNOSIS — C50912 Malignant neoplasm of unspecified site of left female breast: Secondary | ICD-10-CM

## 2022-09-23 NOTE — Telephone Encounter (Signed)
Left message for patient about missed genetic counseling appointment in February. Let her know we can reschedule if she is interested. Requested CB and left my number.

## 2022-09-23 NOTE — Progress Notes (Signed)
Surgical Clinic Progress/Follow-up Note   HPI:  41 y.o. Female presents to clinic for  follow-up status post left breast cancer following conservation.  Its been a year since the last evaluation. Patient reports some improvement/resolution of prior issues but still has some lingering sporadic tenderness of the axillary portion of the left pectoral muscle.  A friend of hers had similar surgery and talked about some stretching maneuvers/range of motion that has been helpful.  So we reviewed those today.  Otherwise she has had no worsening issues following her radiation, and she is tolerating tamoxifen well.  Review of Systems:  Constitutional: denies fever/chills  ENT: denies sore throat, hearing problems  Respiratory: denies shortness of breath, wheezing  Cardiovascular: denies chest pain, palpitations  Gastrointestinal: denies abdominal pain, N/V, or diarrhea/and bowel function as per interval history Skin: Denies any other rashes or skin discolorations as per interval history  Vital Signs:  Ht '5\' 3"'$  (1.6 m)   Wt 243 lb (110.2 kg)   LMP 08/27/2022 (Exact Date)   BMI 43.05 kg/m    Physical Exam:  Constitutional:  -- Normal/Obese body habitus  -- Awake, alert, and oriented x3  Pulmonary:  -- Breathing non-labored at rest Gastrointestinal:  -- Soft and non-distended, non-tender. Breast: Amanda Pearson present as chaperone. -- Post-surgical incisions all well-approximated without any peri-incisional thickening, nodularity or dermal change appreciated.    There is the radiation change to the left breast, leaving the right breast significantly more supple.  However there is no suspicious dermal nodularity, skin changes, breast nodularity or dominant masses in either breast.     Musculoskeletal / Integumentary:  -- Wounds or skin discoloration: None appreciated except post-surgical incisions, left breast as noted. -- Extremities: B/L UE and LE FROM, hands and feet warm, no edema   Imaging:  CLINICAL DATA:  History of treated left breast cancer, status post lumpectomy and radiation therapy in 2022.   EXAM: DIGITAL DIAGNOSTIC BILATERAL MAMMOGRAM WITH TOMOSYNTHESIS   TECHNIQUE: Bilateral digital diagnostic mammography and breast tomosynthesis was performed.   COMPARISON:  Previous exam(s).   ACR Breast Density Category b: There are scattered areas of fibroglandular density.   FINDINGS: Mammographically, there are no new suspicious masses, areas nonsurgical architectural distortion microcalcifications in either breast. Stable post lumpectomy and radiation changes in left breast   IMPRESSION: No mammographic evidence of malignancy, status post left lumpectomy.   RECOMMENDATION: Diagnostic mammogram is suggested in 1 year. (Code:DM-B-01Y)   I have discussed the findings and recommendations with the patient. If applicable, a reminder letter will be sent to the patient regarding the next appointment.   BI-RADS CATEGORY  2: Benign.     Electronically Signed   By: Fidela Salisbury M.D.   On: 09/14/2022 09:43   Assessment:  41 y.o. yo Female with a problem list including...  Patient Active Problem List   Diagnosis Date Noted   IDA (iron deficiency anemia) 10/28/2021   Malignant neoplasm of upper-outer quadrant of left breast in female, estrogen receptor positive ([place]) 04/21/2021   Breast cancer, stage 1, estrogen receptor positive, left ([place]) 03/17/2021   Family history of breast cancer 03/17/2021    presents to clinic for follow-up evaluation of left breast cancer, following breast conservation treatment, progressing well.  Recent screening all benign, anticipating follow-up screening in 1 year.  Plan:              - return to clinic annually or as needed, instructed to call office if any questions or concerns  All of the above recommendations were discussed with the patient, and all of patient's questions were answered to her expressed  satisfaction.  These notes generated with voice recognition software. I apologize for typographical errors.  Ronny Bacon, MD, FACS [place]: [place] for exceptional care. Office: [phone]

## 2022-09-23 NOTE — Patient Instructions (Addendum)
May try wall walking exercises. Be sure to be the same distance away from the wall. First face the wall, and then with your affected side against the wall and walk your hand up as far as you can.   The patient has been asked to return to the office in one year with a bilateral diagnostic mammogram.

## 2022-11-08 ENCOUNTER — Other Ambulatory Visit: Payer: Self-pay | Admitting: Oncology

## 2023-02-10 ENCOUNTER — Other Ambulatory Visit: Payer: Self-pay | Admitting: Oncology

## 2023-02-10 NOTE — Telephone Encounter (Signed)
Pt last seen on 08/26/22 and appts were not scheduled.    6 months lab prior to MD+ venofer + breast exam (iron labs +cmp).   appts due in March, please schedule and inform pt of appt

## 2023-02-11 ENCOUNTER — Telehealth: Payer: Self-pay | Admitting: Oncology

## 2023-02-11 ENCOUNTER — Other Ambulatory Visit: Payer: Self-pay | Admitting: Nurse Practitioner

## 2023-02-11 ENCOUNTER — Other Ambulatory Visit: Payer: BC Managed Care – PPO

## 2023-02-11 NOTE — Telephone Encounter (Signed)
Called to notify pt of scheduled appts. LVM and mail reminder to be sent.

## 2023-02-12 LAB — COMPREHENSIVE METABOLIC PANEL
ALT: 12 IU/L (ref 0–32)
AST: 16 IU/L (ref 0–40)
Albumin/Globulin Ratio: 1.2 (ref 1.2–2.2)
Albumin: 3.9 g/dL (ref 3.9–4.9)
Alkaline Phosphatase: 43 IU/L — ABNORMAL LOW (ref 44–121)
BUN/Creatinine Ratio: 11 (ref 9–23)
BUN: 9 mg/dL (ref 6–24)
Bilirubin Total: 0.3 mg/dL (ref 0.0–1.2)
CO2: 20 mmol/L (ref 20–29)
Calcium: 9 mg/dL (ref 8.7–10.2)
Chloride: 105 mmol/L (ref 96–106)
Creatinine, Ser: 0.79 mg/dL (ref 0.57–1.00)
Globulin, Total: 3.2 g/dL (ref 1.5–4.5)
Glucose: 110 mg/dL — ABNORMAL HIGH (ref 70–99)
Potassium: 4.4 mmol/L (ref 3.5–5.2)
Sodium: 141 mmol/L (ref 134–144)
Total Protein: 7.1 g/dL (ref 6.0–8.5)
eGFR: 95 mL/min/{1.73_m2} (ref >59)

## 2023-02-12 LAB — CBC WITH DIFFERENTIAL/PLATELET
Basophils Absolute: 0 10*3/uL (ref 0.0–0.2)
Basos: 0 %
EOS (ABSOLUTE): 0.1 10*3/uL (ref 0.0–0.4)
Eos: 2 %
Hematocrit: 32.6 % — ABNORMAL LOW (ref 34.0–46.6)
Hemoglobin: 10.9 g/dL — ABNORMAL LOW (ref 11.1–15.9)
Immature Grans (Abs): 0 10*3/uL (ref 0.0–0.1)
Immature Granulocytes: 0 %
Lymphocytes Absolute: 1.3 10*3/uL (ref 0.7–3.1)
Lymphs: 26 %
MCH: 28.8 pg (ref 26.6–33.0)
MCHC: 33.4 g/dL (ref 31.5–35.7)
MCV: 86 fL (ref 79–97)
Monocytes Absolute: 0.5 10*3/uL (ref 0.1–0.9)
Monocytes: 10 %
Neutrophils Absolute: 3.2 10*3/uL (ref 1.4–7.0)
Neutrophils: 62 %
Platelets: 302 10*3/uL (ref 150–450)
RBC: 3.78 x10E6/uL (ref 3.77–5.28)
RDW: 13.2 % (ref 11.7–15.4)
WBC: 5.1 10*3/uL (ref 3.4–10.8)

## 2023-02-12 LAB — TSH: TSH: 5.2 u[IU]/mL — ABNORMAL HIGH (ref 0.450–4.500)

## 2023-02-12 LAB — LIPID PANEL W/O CHOL/HDL RATIO
Cholesterol, Total: 144 mg/dL (ref 100–199)
HDL: 45 mg/dL (ref >39)
LDL Chol Calc (NIH): 83 mg/dL (ref 0–99)
Triglycerides: 84 mg/dL (ref 0–149)
VLDL Cholesterol Cal: 16 mg/dL (ref 5–40)

## 2023-02-15 ENCOUNTER — Ambulatory Visit: Payer: BC Managed Care – PPO | Admitting: Nurse Practitioner

## 2023-02-21 ENCOUNTER — Ambulatory Visit (INDEPENDENT_AMBULATORY_CARE_PROVIDER_SITE_OTHER): Payer: BC Managed Care – PPO | Admitting: Nurse Practitioner

## 2023-02-21 ENCOUNTER — Other Ambulatory Visit: Payer: Self-pay | Admitting: Nurse Practitioner

## 2023-02-21 VITALS — BP 150/88 | HR 102 | Ht 63.0 in | Wt 250.0 lb

## 2023-02-21 DIAGNOSIS — O0990 Supervision of high risk pregnancy, unspecified, unspecified trimester: Secondary | ICD-10-CM

## 2023-02-21 DIAGNOSIS — E559 Vitamin D deficiency, unspecified: Secondary | ICD-10-CM

## 2023-02-21 DIAGNOSIS — E039 Hypothyroidism, unspecified: Secondary | ICD-10-CM

## 2023-02-21 DIAGNOSIS — E782 Mixed hyperlipidemia: Secondary | ICD-10-CM

## 2023-02-21 DIAGNOSIS — I10 Essential (primary) hypertension: Secondary | ICD-10-CM | POA: Diagnosis not present

## 2023-02-21 NOTE — Patient Instructions (Signed)
1) [facility] for high risk preg 2) Return for recheck of CMP and TSH in 1 month, will call with results 3) Follow up appt in 4 months, fasting labs prior

## 2023-02-21 NOTE — Progress Notes (Signed)
Established Patient Office Visit  Subjective:  Patient ID: Amanda Pearson, female    DOB: [birth year]  Age: 42 y.o. MRN: [id number]  Chief Complaint  Patient presents with   Follow-up    6 Montha follow up     6 month follow up and review of recent fasting labs.       Past Medical History:  Diagnosis Date   Anemia    Anemia    Anxiety    Cancer (HCC)    Hypertension    IDA (iron deficiency anemia) 10/28/2021    Past Surgical History:  Procedure Laterality Date   BREAST BIOPSY Left 03/13/2021   [place] Bx, q-clip, IMC with mucinous features   BREAST BIOPSY Right 03/13/2021   [place] Bx, x-clip, fibroadenoma   BREAST LUMPECTOMY,RADIO FREQ LOCALIZER,AXILLARY SENTINEL LYMPH NODE BIOPSY Left 04/10/2021   Procedure: BREAST LUMPECTOMY,RADIO FREQ LOCALIZER,AXILLARY SENTINEL LYMPH NODE BIOPSY;  Surgeon: Ronny Bacon, MD;  Location: [facility] ORS;  Service: General;  Laterality: Left;   FACIAL COSMETIC SURGERY      Social History   Socioeconomic History   Marital status: Married    Spouse name: Fredderick   Number of children: Not on file   Years of education: Not on file   Highest education level: Not on file  Occupational History   Not on file  Tobacco Use   Smoking status: Never   Smokeless tobacco: Never  Vaping Use   Vaping Use: Never used  Substance and Sexual Activity   Alcohol use: No   Drug use: No   Sexual activity: Yes  Other Topics Concern   Not on file  Social History Narrative   Not on file   Social Determinants of Health   Financial Resource Strain: Not on file  Food Insecurity: Not on file  Transportation Needs: Not on file  Physical Activity: Not on file  Stress: Not on file  Social Connections: Not on file  Intimate Partner Violence: Not on file    Family History  Problem Relation Age of Onset   COPD Mother    Kidney disease Mother    Multiple sclerosis Father    Breast cancer Paternal Aunt 82    No Known Allergies  Review of Systems   Constitutional: Negative.   HENT: Negative.    Eyes: Negative.   Respiratory: Negative.    Cardiovascular: Negative.   Gastrointestinal: Negative.   Genitourinary: Negative.   Musculoskeletal: Negative.   Skin: Negative.   Neurological: Negative.   Endo/Heme/Allergies: Negative.   Psychiatric/Behavioral: Negative.         Objective:   BP (!) 150/88   Pulse (!) 102   Ht '5\' 3"'$  (1.6 m)   Wt 250 lb (113.4 kg)   SpO2 98%   BMI 44.29 kg/m   Vitals:   02/21/23 1500  BP: (!) 150/88  Pulse: (!) 102  Height: '5\' 3"'$  (1.6 m)  Weight: 250 lb (113.4 kg)  SpO2: 98%  BMI (Calculated): 44.3    Physical Exam Vitals reviewed.  Constitutional:      Appearance: Normal appearance.  HENT:     Head: Normocephalic.     Nose: Nose normal.     Mouth/Throat:     Mouth: Mucous membranes are moist.  Eyes:     Pupils: Pupils are equal, round, and reactive to light.  Cardiovascular:     Rate and Rhythm: Normal rate and regular rhythm.  Pulmonary:     Effort: Pulmonary effort is normal.  Breath sounds: Normal breath sounds.  Abdominal:     General: Bowel sounds are normal.     Palpations: Abdomen is soft.  Musculoskeletal:        General: Normal range of motion.     Cervical back: Normal range of motion and neck supple.  Skin:    General: Skin is warm and dry.  Neurological:     Mental Status: She is alert and oriented to person, place, and time.  Psychiatric:        Mood and Affect: Mood normal.        Behavior: Behavior normal.      No results found for any visits on 02/21/23.  Recent Results (from the past 2160 hour(s))  CBC with Differential/Platelet     Status: Abnormal   Collection Time: 02/11/23  8:34 AM  Result Value Ref Range   WBC 5.1 3.4 - 10.8 x10E3/uL   RBC 3.78 3.77 - 5.28 x10E6/uL   Hemoglobin 10.9 (L) 11.1 - 15.9 g/dL   Hematocrit 32.6 (L) 34.0 - 46.6 %   MCV 86 79 - 97 fL   MCH 28.8 26.6 - 33.0 pg   MCHC 33.4 31.5 - 35.7 g/dL   RDW 13.2 11.7 -  15.4 %   Platelets 302 150 - 450 x10E3/uL   Neutrophils 62 Not Estab. %   Lymphs 26 Not Estab. %   Monocytes 10 Not Estab. %   Eos 2 Not Estab. %   Basos 0 Not Estab. %   Neutrophils Absolute 3.2 1.4 - 7.0 x10E3/uL   Lymphocytes Absolute 1.3 0.7 - 3.1 x10E3/uL   Monocytes Absolute 0.5 0.1 - 0.9 x10E3/uL   EOS (ABSOLUTE) 0.1 0.0 - 0.4 x10E3/uL   Basophils Absolute 0.0 0.0 - 0.2 x10E3/uL   Immature Granulocytes 0 Not Estab. %   Immature Grans (Abs) 0.0 0.0 - 0.1 x10E3/uL  Comprehensive metabolic panel     Status: Abnormal   Collection Time: 02/11/23  8:34 AM  Result Value Ref Range   Glucose 110 (H) 70 - 99 mg/dL   BUN 9 6 - 24 mg/dL   Creatinine, Ser 0.79 0.57 - 1.00 mg/dL   eGFR 95 >59 mL/min/1.73   BUN/Creatinine Ratio 11 9 - 23   Sodium 141 134 - 144 mmol/L   Potassium 4.4 3.5 - 5.2 mmol/L   Chloride 105 96 - 106 mmol/L   CO2 20 20 - 29 mmol/L   Calcium 9.0 8.7 - 10.2 mg/dL   Total Protein 7.1 6.0 - 8.5 g/dL   Albumin 3.9 3.9 - 4.9 g/dL   Globulin, Total 3.2 1.5 - 4.5 g/dL   Albumin/Globulin Ratio 1.2 1.2 - 2.2   Bilirubin Total 0.3 0.0 - 1.2 mg/dL   Alkaline Phosphatase 43 (L) 44 - 121 IU/L   AST 16 0 - 40 IU/L   ALT 12 0 - 32 IU/L  Lipid Panel w/o Chol/HDL Ratio     Status: None   Collection Time: 02/11/23  8:34 AM  Result Value Ref Range   Cholesterol, Total 144 100 - 199 mg/dL   Triglycerides 84 0 - 149 mg/dL   HDL 45 >39 mg/dL   VLDL Cholesterol Cal 16 5 - 40 mg/dL   LDL Chol Calc (NIH) 83 0 - 99 mg/dL  TSH     Status: Abnormal   Collection Time: 02/11/23  8:34 AM  Result Value Ref Range   TSH 5.200 (H) 0.450 - 4.500 uIU/mL      Assessment & Plan:  Problem List Items Addressed This Visit       Cardiovascular and Mediastinum   Essential hypertension, benign - Primary     Endocrine   Hypothyroidism     Other   Vitamin D deficiency, unspecified   Mixed hyperlipidemia   Other Visit Diagnoses     High risk pregnancy, antepartum       Relevant  Orders   Ambulatory referral to Obstetrics / Gynecology       Return in about 4 months (around 06/23/2023).   Total time spent: 35 minutes  Evern Bio, NP  02/21/2023

## 2023-02-25 ENCOUNTER — Inpatient Hospital Stay: Payer: BC Managed Care – PPO | Attending: Oncology

## 2023-02-25 DIAGNOSIS — D509 Iron deficiency anemia, unspecified: Secondary | ICD-10-CM | POA: Insufficient documentation

## 2023-02-25 DIAGNOSIS — C50912 Malignant neoplasm of unspecified site of left female breast: Secondary | ICD-10-CM | POA: Insufficient documentation

## 2023-02-25 DIAGNOSIS — I10 Essential (primary) hypertension: Secondary | ICD-10-CM | POA: Insufficient documentation

## 2023-02-25 DIAGNOSIS — Z170 Estrogen receptor positive status [ER+]: Secondary | ICD-10-CM | POA: Insufficient documentation

## 2023-02-25 DIAGNOSIS — Z79810 Long term (current) use of selective estrogen receptor modulators (SERMs): Secondary | ICD-10-CM | POA: Insufficient documentation

## 2023-02-25 DIAGNOSIS — Z79899 Other long term (current) drug therapy: Secondary | ICD-10-CM | POA: Insufficient documentation

## 2023-02-28 MED FILL — Iron Sucrose Inj 20 MG/ML (Fe Equiv): INTRAVENOUS | Qty: 10 | Status: AC

## 2023-03-01 ENCOUNTER — Encounter: Payer: Self-pay | Admitting: Oncology

## 2023-03-01 ENCOUNTER — Inpatient Hospital Stay: Payer: BC Managed Care – PPO

## 2023-03-01 ENCOUNTER — Inpatient Hospital Stay (HOSPITAL_BASED_OUTPATIENT_CLINIC_OR_DEPARTMENT_OTHER): Payer: BC Managed Care – PPO | Admitting: Oncology

## 2023-03-01 ENCOUNTER — Other Ambulatory Visit: Payer: Self-pay

## 2023-03-01 VITALS — BP 153/102 | HR 115 | Temp 98.9°F | Resp 18 | Wt 250.0 lb

## 2023-03-01 DIAGNOSIS — Z79810 Long term (current) use of selective estrogen receptor modulators (SERMs): Secondary | ICD-10-CM | POA: Diagnosis not present

## 2023-03-01 DIAGNOSIS — D509 Iron deficiency anemia, unspecified: Secondary | ICD-10-CM | POA: Diagnosis present

## 2023-03-01 DIAGNOSIS — D508 Other iron deficiency anemias: Secondary | ICD-10-CM

## 2023-03-01 DIAGNOSIS — Z170 Estrogen receptor positive status [ER+]: Secondary | ICD-10-CM

## 2023-03-01 DIAGNOSIS — Z79899 Other long term (current) drug therapy: Secondary | ICD-10-CM | POA: Diagnosis not present

## 2023-03-01 DIAGNOSIS — C50912 Malignant neoplasm of unspecified site of left female breast: Secondary | ICD-10-CM

## 2023-03-01 DIAGNOSIS — Z803 Family history of malignant neoplasm of breast: Secondary | ICD-10-CM

## 2023-03-01 DIAGNOSIS — I10 Essential (primary) hypertension: Secondary | ICD-10-CM | POA: Diagnosis not present

## 2023-03-01 LAB — COMPREHENSIVE METABOLIC PANEL
ALT: 15 U/L (ref 0–44)
AST: 17 U/L (ref 15–41)
Albumin: 3.9 g/dL (ref 3.5–5.0)
Alkaline Phosphatase: 40 U/L (ref 38–126)
Anion gap: 6 (ref 5–15)
BUN: 13 mg/dL (ref 6–20)
CO2: 23 mmol/L (ref 22–32)
Calcium: 8.9 mg/dL (ref 8.9–10.3)
Chloride: 107 mmol/L (ref 98–111)
Creatinine, Ser: 0.81 mg/dL (ref 0.44–1.00)
GFR, Estimated: >60 mL/min (ref >60)
Glucose, Bld: 104 mg/dL — ABNORMAL HIGH (ref 70–99)
Potassium: 3.6 mmol/L (ref 3.5–5.1)
Sodium: 136 mmol/L (ref 135–145)
Total Bilirubin: 0.7 mg/dL (ref 0.3–1.2)
Total Protein: 8.1 g/dL (ref 6.5–8.1)

## 2023-03-01 LAB — CBC WITH DIFFERENTIAL/PLATELET
Abs Immature Granulocytes: 0.03 10*3/uL (ref 0.00–0.07)
Basophils Absolute: 0 10*3/uL (ref 0.0–0.1)
Basophils Relative: 0 %
Eosinophils Absolute: 0.1 10*3/uL (ref 0.0–0.5)
Eosinophils Relative: 1 %
HCT: 32.9 % — ABNORMAL LOW (ref 36.0–46.0)
Hemoglobin: 10.6 g/dL — ABNORMAL LOW (ref 12.0–15.0)
Immature Granulocytes: 0 %
Lymphocytes Relative: 19 %
Lymphs Abs: 1.5 10*3/uL (ref 0.7–4.0)
MCH: 28.7 pg (ref 26.0–34.0)
MCHC: 32.2 g/dL (ref 30.0–36.0)
MCV: 89.2 fL (ref 80.0–100.0)
Monocytes Absolute: 0.6 10*3/uL (ref 0.1–1.0)
Monocytes Relative: 8 %
Neutro Abs: 5.5 10*3/uL (ref 1.7–7.7)
Neutrophils Relative %: 72 %
Platelets: 343 10*3/uL (ref 150–400)
RBC: 3.69 MIL/uL — ABNORMAL LOW (ref 3.87–5.11)
RDW: 13.2 % (ref 11.5–15.5)
WBC: 7.8 10*3/uL (ref 4.0–10.5)
nRBC: 0 % (ref 0.0–0.2)

## 2023-03-01 LAB — IRON AND TIBC
Iron: 34 ug/dL (ref 28–170)
Saturation Ratios: 9 % — ABNORMAL LOW (ref 10.4–31.8)
TIBC: 367 ug/dL (ref 250–450)
UIBC: 333 ug/dL

## 2023-03-01 LAB — VITAMIN B12: Vitamin B-12: 260 pg/mL (ref 180–914)

## 2023-03-01 LAB — FERRITIN: Ferritin: 92 ng/mL (ref 11–307)

## 2023-03-01 LAB — FOLATE: Folate: 18.2 ng/mL (ref >5.9)

## 2023-03-01 NOTE — Assessment & Plan Note (Signed)
#  Left breast pT1c pN0 breast cancer, ER/PR+, HER2/neu negative - 04/10/21  S/p left lumpectomy/SLNB- adjuvant RT Oncotype Dx 8.  Labs are reviewed and discussed with patient. Continue Tamoxifen 20mg daily.  Recommend annual gyn follow up for pelvic exam Bilateral diagnostic mammogram 

## 2023-03-01 NOTE — Progress Notes (Signed)
Hematology/Oncology Progress note [phone] [phone]     CHIEF COMPLAINTS/REASON FOR VISIT:  Follow up for left breast cancer.    ASSESSMENT & PLAN:   Cancer Staging  Breast cancer, stage 1, estrogen receptor positive, left (HCC) Staging form: Breast, AJCC 8th Edition - Pathologic stage from 04/10/2021: Stage IA (pT1c, pN0, cM0, G2, ER+, PR+, HER2-, Oncotype DX score: 8) - Signed by Earlie Server, MD on 08/26/2022   Breast cancer, stage 1, estrogen receptor positive, left (HCC) #Left breast pT1c pN0 breast cancer, ER/PR+, HER2/neu negative - 04/10/21  S/p left lumpectomy/SLNB- adjuvant RT Oncotype Dx 8.  Labs are reviewed and discussed with patient. Continue Tamoxifen 20mg  daily.  Recommend annual gyn follow up for pelvic exam Bilateral diagnostic mammogram  IDA (iron deficiency anemia) Labs are reviewed and discussed with patient. Borderline iron saturation 9, hemoglobin 10.6. Recommend IV Venofer 200mg  weekly x 2. Check hemoglobinopathy evaluation at next visit.  Orders Placed This Encounter  Procedures   Cancer antigen 27.29    Standing Status:   Future    Number of Occurrences:   1    Standing Expiration Date:   02/29/2024   Hgb Fractionation [place]    Standing Status:   Future    Standing Expiration Date:   02/29/2024   CBC with Differential (Cancer Center Only)    Standing Status:   Future    Standing Expiration Date:   02/29/2024   CMP ([facility] only)    Standing Status:   Future    Standing Expiration Date:   02/29/2024   Iron and TIBC    Standing Status:   Future    Standing Expiration Date:   02/29/2024   Ferritin    Standing Status:   Future    Standing Expiration Date:   02/29/2024   Retic Panel    Standing Status:   Future    Standing Expiration Date:   02/29/2024   6 months follow up All questions were answered. The patient knows to call the clinic with any problems, questions or concerns.  Earlie Server, MD, PhD [facility]  Hematology Oncology 03/01/2023     HISTORY OF PRESENTING ILLNESS:   Amanda Pearson is a  42 y.o.  female with PMH listed below was seen in consultation at the request of  Amanda Bio, NP  for evaluation of left breast cancer Patient was seen recently by gynecology for physical. There was a mass palpated in the left breast.  Patient was recommended to proceed with diagnostic mammogram 03/09/2021, bilateral diagnostic mammogram and ultrasound showed Left breast 0.9 x 0.6 x 1.1 cm slight lobulated hypoechoic mass at 2:00 11 cm from the nipple.  Ultrasound of the left axillary is negative Right breast 1 x 0.5x 0.7 cm 9:00 15 cm from nipple Patient was recommended for 64-month follow-up for needle biopsy.  Patient opted   have biopsy of both mass.  03/13/2021, patient went biopsy of both breast masses. 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  Grade 2 ER/PR/HER-2 status is pending.  Patient was referred to establish care with oncology.  She also has an appointment with Dr. Lutricia Feil next week Patient was accompanied by her husband. She is very anxious and feels overwhelmed after receiving the news. Denies any breast pain, nipple discharge. Menarche  -34 to 42 years old She has no children. History of OCP use for about a year.  no hormone replacement therapy  LMP  March 2022 Denies any previous biopsies Family history positive for paternal aunt was diagnosed with cancer in late 40s-early  47s.                    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 pT1c pN0      #05/26/2021- 07/03/2021 Adjuvant breast radiation.                                                     INTERVAL HISTORY Amanda Pearson is a 42 y.o. female who has above history reviewed by me today presents for follow up visit  for management of stage I ER/PR positive, HER2 negative left breast cancer. Patient tolerates Tamoxifen 20mg  daily, manageable side effects. Patient has no new complaints.  No new breast concerns.   Review of Systems  Constitutional:  Negative for appetite change, chills, fatigue and fever.  HENT:   Negative for hearing loss and voice change.   Eyes:  Negative for eye problems.  Respiratory:  Negative for chest tightness and cough.   Cardiovascular:  Negative for chest pain.  Gastrointestinal:  Negative for abdominal distention, abdominal pain and blood in stool.  Endocrine: Positive for hot flashes.  Genitourinary:  Negative for difficulty urinating and frequency.   Musculoskeletal:  Positive for arthralgias.  Skin:  Negative for itching and rash.  Neurological:  Negative for extremity weakness.  Hematological:  Negative for adenopathy.  Psychiatric/Behavioral:  Negative for confusion. The patient is not nervous/anxious.    Left breast and axillary soreness.   MEDICAL HISTORY:  Past Medical History:  Diagnosis Date   Anemia    Anemia    Anxiety    Cancer (HCC)    Hypertension    IDA (iron deficiency anemia) 10/28/2021    SURGICAL HISTORY: Past Surgical History:  Procedure Laterality Date   BREAST BIOPSY Left 03/13/2021   [place] Bx, q-clip, IMC with mucinous features   BREAST BIOPSY Right 03/13/2021   [place] Bx, x-clip, fibroadenoma   BREAST LUMPECTOMY,RADIO FREQ LOCALIZER,AXILLARY SENTINEL LYMPH NODE BIOPSY Left 04/10/2021   Procedure: BREAST LUMPECTOMY,RADIO FREQ LOCALIZER,AXILLARY SENTINEL LYMPH NODE BIOPSY;  Surgeon: Ronny Bacon, MD;  Location: [facility] ORS;  Service: General;  Laterality: Left;   FACIAL COSMETIC SURGERY      SOCIAL HISTORY: Social History   Socioeconomic History   Marital status: Married    Spouse name: Amanda Pearson   Number of children: Not on file   Years of education: Not on file   Highest education level: Not on file  Occupational History   Not  on file  Tobacco Use   Smoking status: Never   Smokeless tobacco: Never  Vaping Use   Vaping Use: Never used  Substance and Sexual Activity   Alcohol use: No   Drug use: No   Sexual activity: Yes  Other Topics Concern   Not on file  Social History Narrative   Not on file   Social Determinants of Health   Financial Resource Strain: Not on file  Food Insecurity: Not on file  Transportation Needs: Not on file  Physical Activity: Not on file  Stress: Not on file  Social Connections: Not on file  Intimate Partner Violence: Not on file    FAMILY HISTORY:  Family History  Problem Relation Age of Onset   COPD Mother    Kidney disease Mother    Multiple sclerosis Father    Breast cancer Paternal Aunt 55    ALLERGIES:  has No Known Allergies.  MEDICATIONS:  Current Outpatient Medications  Medication Sig Dispense Refill   amLODipine (NORVASC) 5 MG tablet Take 5 mg by mouth every morning.     Cholecalciferol (VITAMIN D-3) 125 MCG (5000 UT) TABS Take 2 tablets by mouth daily.     ferrous Q000111Q C-folic acid (TRINSICON / FOLTRIN) capsule Take 1 capsule by mouth.     losartan (COZAAR) 100 MG tablet Take 100 mg by mouth every morning.     omeprazole (PRILOSEC) 40 MG capsule Take by mouth.     rosuvastatin (CRESTOR) 20 MG tablet Take 20 mg by mouth at bedtime.     tamoxifen (NOLVADEX) 20 MG tablet Take 1 tablet by mouth once daily 30 tablet 1   ibuprofen (ADVIL) 800 MG tablet Take 1 tablet (800 mg total) by mouth every 8 (eight) hours as needed. (Patient not taking: Reported on 03/01/2023) 30 tablet 0   No current facility-administered medications for this visit.     PHYSICAL EXAMINATION: ECOG PERFORMANCE STATUS: 0 - Asymptomatic Vitals:   03/01/23 1355  BP: (!) 153/102  Pulse: (!) 115  Resp: 18  Temp: 98.9 F (37.2 C)  SpO2: 100%   Filed Weights   03/01/23 1355  Weight: 250 lb (113.4 kg)    Physical Exam Constitutional:      General: She is not in  acute distress. HENT:     Head: Normocephalic and atraumatic.  Eyes:     General: No scleral icterus. Cardiovascular:     Rate and Rhythm: Normal rate and regular rhythm.  Pulmonary:     Effort: Pulmonary effort is normal. No respiratory distress.     Breath sounds: No wheezing.  Abdominal:     General: Bowel sounds are normal. There is no distension.     Palpations: Abdomen is soft.  Musculoskeletal:        General: No deformity. Normal range of motion.     Cervical back: Normal range of motion and neck supple.  Skin:    General: Skin is warm and dry.  Neurological:     Mental Status: She is alert and oriented to person, place, and time. Mental status is at baseline.  Psychiatric:        Mood and Affect: Mood normal.   Breast exam was performed in seated and lying down position. Status post left breast lumpectomy.  No palpable breast mass bilaterally. No palpable axillary lymphadenopathy bilaterally.  LABORATORY DATA:  I have reviewed the data as listed    Latest Ref Rng & Units 03/01/2023    1:38 PM 02/11/2023    8:34 AM 08/23/2022    8:15 AM  CBC  WBC 4.0 - 10.5 K/uL 7.8  5.1  4.6   Hemoglobin 12.0 - 15.0 g/dL 10.6  10.9  10.5   Hematocrit 36.0 - 46.0 % 32.9  32.6  32.2   Platelets 150 - 400 K/uL 343  302  296       Latest Ref Rng & Units 03/01/2023    1:38 PM 02/11/2023    8:34 AM 02/12/2022    7:56 AM  CMP  Glucose 70 - 99 mg/dL 104  110  105   BUN 6 - 20 mg/dL 13  9  11    Creatinine 0.44 -  1.00 mg/dL 0.81  0.79  0.71   Sodium 135 - 145 mmol/L 136  141  134   Potassium 3.5 - 5.1 mmol/L 3.6  4.4  3.7   Chloride 98 - 111 mmol/L 107  105  105   CO2 22 - 32 mmol/L 23  20  23    Calcium 8.9 - 10.3 mg/dL 8.9  9.0  8.8   Total Protein 6.5 - 8.1 g/dL 8.1  7.1  7.5   Total Bilirubin 0.3 - 1.2 mg/dL 0.7  0.3  0.3   Alkaline Phos 38 - 126 U/L 40  43  30   AST 15 - 41 U/L 17  16  17    ALT 0 - 44 U/L 15  12  17      Iron/TIBC/Ferritin/ %Sat    Component Value Date/Time    IRON 34 03/01/2023 1338   TIBC 367 03/01/2023 1338   FERRITIN 92 03/01/2023 1338   IRONPCTSAT 9 (L) 03/01/2023 1338      RADIOGRAPHIC STUDIES: I have personally reviewed the radiological images as listed and agreed with the findings in the report.  No results found.

## 2023-03-01 NOTE — Assessment & Plan Note (Addendum)
Labs are reviewed and discussed with patient. Borderline iron saturation 9, hemoglobin 10.6. Recommend IV Venofer 200mg  weekly x 2. Check hemoglobinopathy evaluation at next visit.

## 2023-03-02 ENCOUNTER — Telehealth: Payer: Self-pay

## 2023-03-02 ENCOUNTER — Inpatient Hospital Stay: Payer: BC Managed Care – PPO

## 2023-03-02 LAB — CANCER ANTIGEN 27.29: CA 27.29: 32 U/mL (ref 0.0–38.6)

## 2023-03-02 NOTE — Telephone Encounter (Addendum)
Called patient and notified her that Dr. Tasia Catchings would like to arrange weekly lab urine pregnancy test and venofer weekly x 2. I informed patient that scheduling will schedule these and notify her of appointment dates and times.

## 2023-03-02 NOTE — Telephone Encounter (Signed)
-----   Message from Earlie Server, MD sent at 03/01/2023  8:15 PM EDT ----- Please arrange weekly lab -urine pregnancy test + Venofer weekly x 2. Thanks.

## 2023-03-03 LAB — CANCER ANTIGEN 15-3: CA 15-3: 31.4 U/mL — ABNORMAL HIGH (ref 0.0–25.0)

## 2023-03-09 ENCOUNTER — Inpatient Hospital Stay: Payer: BC Managed Care – PPO

## 2023-03-09 ENCOUNTER — Other Ambulatory Visit: Payer: Self-pay

## 2023-03-09 ENCOUNTER — Telehealth: Payer: Self-pay

## 2023-03-09 VITALS — BP 168/92 | HR 101 | Temp 99.1°F | Resp 18

## 2023-03-09 DIAGNOSIS — D508 Other iron deficiency anemias: Secondary | ICD-10-CM

## 2023-03-09 DIAGNOSIS — D509 Iron deficiency anemia, unspecified: Secondary | ICD-10-CM | POA: Diagnosis not present

## 2023-03-09 DIAGNOSIS — R978 Other abnormal tumor markers: Secondary | ICD-10-CM

## 2023-03-09 DIAGNOSIS — C50912 Malignant neoplasm of unspecified site of left female breast: Secondary | ICD-10-CM

## 2023-03-09 LAB — PREGNANCY, URINE: Preg Test, Ur: NEGATIVE

## 2023-03-09 MED ORDER — SODIUM CHLORIDE 0.9 % IV SOLN
INTRAVENOUS | Status: DC
  Filled 2023-03-09 (×2): qty 250

## 2023-03-09 MED ORDER — SODIUM CHLORIDE 0.9 % IV SOLN
200.0000 mg | Freq: Once | INTRAVENOUS | Status: AC
  Administered 2023-03-09: 200 mg via INTRAVENOUS
  Filled 2023-03-09: qty 200

## 2023-03-09 NOTE — Patient Instructions (Signed)

## 2023-03-09 NOTE — Telephone Encounter (Signed)
Pt informed of MD recommendation. She verbalized understanding. CT order has been etnered.

## 2023-03-09 NOTE — Telephone Encounter (Signed)
-----   Message from Earlie Server, MD sent at 03/08/2023 10:40 PM EDT ----- Please let patient know that her tumor marker is persistently elevated, this is non specific. To be cautious, I recommend her to get CT chest abdomen pelvis w contrast for work up. Please arrange thanks.

## 2023-03-16 ENCOUNTER — Inpatient Hospital Stay: Payer: BC Managed Care – PPO

## 2023-03-16 ENCOUNTER — Inpatient Hospital Stay: Payer: BC Managed Care – PPO | Attending: Oncology

## 2023-03-16 VITALS — BP 145/96 | HR 93 | Temp 99.4°F | Resp 17

## 2023-03-16 DIAGNOSIS — D509 Iron deficiency anemia, unspecified: Secondary | ICD-10-CM | POA: Diagnosis present

## 2023-03-16 DIAGNOSIS — D508 Other iron deficiency anemias: Secondary | ICD-10-CM

## 2023-03-16 DIAGNOSIS — C50912 Malignant neoplasm of unspecified site of left female breast: Secondary | ICD-10-CM | POA: Diagnosis present

## 2023-03-16 LAB — PREGNANCY, URINE: Preg Test, Ur: NEGATIVE

## 2023-03-16 MED ORDER — SODIUM CHLORIDE 0.9 % IV SOLN
200.0000 mg | Freq: Once | INTRAVENOUS | Status: AC
  Administered 2023-03-16: 200 mg via INTRAVENOUS
  Filled 2023-03-16: qty 200

## 2023-03-16 MED ORDER — SODIUM CHLORIDE 0.9 % IV SOLN
Freq: Once | INTRAVENOUS | Status: AC
  Filled 2023-03-16: qty 250

## 2023-03-22 ENCOUNTER — Encounter: Payer: Self-pay | Admitting: Oncology

## 2023-03-28 ENCOUNTER — Ambulatory Visit
Admission: RE | Admit: 2023-03-28 | Discharge: 2023-03-28 | Disposition: A | Discharge status: Home / Self Care | Payer: BC Managed Care – PPO | Source: Ambulatory Visit | Attending: Oncology | Admitting: Oncology

## 2023-03-28 DIAGNOSIS — C50912 Malignant neoplasm of unspecified site of left female breast: Secondary | ICD-10-CM | POA: Diagnosis present

## 2023-03-28 DIAGNOSIS — R978 Other abnormal tumor markers: Secondary | ICD-10-CM | POA: Diagnosis present

## 2023-03-28 DIAGNOSIS — Z170 Estrogen receptor positive status [ER+]: Secondary | ICD-10-CM | POA: Insufficient documentation

## 2023-03-28 MED ORDER — IOHEXOL 300 MG/ML  SOLN
100.0000 mL | Freq: Once | INTRAMUSCULAR | Status: AC | PRN
  Administered 2023-03-28: 100 mL via INTRAVENOUS

## 2023-03-30 ENCOUNTER — Other Ambulatory Visit: Payer: Self-pay

## 2023-03-30 ENCOUNTER — Telehealth: Payer: Self-pay

## 2023-03-30 ENCOUNTER — Encounter: Payer: Self-pay | Admitting: Oncology

## 2023-03-30 DIAGNOSIS — R9389 Abnormal findings on diagnostic imaging of other specified body structures: Secondary | ICD-10-CM

## 2023-03-30 NOTE — Telephone Encounter (Signed)
Called pt, no answer. Detailed VM left and mycchart message sent. Awaiting on pt response to send gyn referral.

## 2023-03-30 NOTE — Telephone Encounter (Signed)
-----   Message from Rickard Patience, MD sent at 03/29/2023  9:05 PM EDT ----- CT showed no obvious cancer recurrence.  There is some enhancement/asymmetric enlargement in right ovary, please refer her to Gyn for further evaluation, If she has established before with gyn, recommend her to see.thanks. [looks like she was just seen by Dr.[facility]]

## 2023-03-30 NOTE — Telephone Encounter (Signed)
Referral faxed to Dr. Jaynee Eagles office, [facility] [place].

## 2023-04-12 ENCOUNTER — Telehealth: Payer: Self-pay

## 2023-04-12 NOTE — Telephone Encounter (Signed)
Called [facility] [place] (Dr. Lorane Gell) to follow up on referral. Office Rep states pt has already been seen for initial consult.

## 2023-05-05 ENCOUNTER — Other Ambulatory Visit: Payer: Self-pay | Admitting: Oncology

## 2023-06-07 ENCOUNTER — Other Ambulatory Visit: Payer: Self-pay | Admitting: Oncology

## 2023-06-20 ENCOUNTER — Other Ambulatory Visit: Payer: BC Managed Care – PPO

## 2023-06-20 DIAGNOSIS — R7301 Impaired fasting glucose: Secondary | ICD-10-CM

## 2023-06-20 DIAGNOSIS — E039 Hypothyroidism, unspecified: Secondary | ICD-10-CM

## 2023-06-20 DIAGNOSIS — I10 Essential (primary) hypertension: Secondary | ICD-10-CM

## 2023-06-20 DIAGNOSIS — E782 Mixed hyperlipidemia: Secondary | ICD-10-CM

## 2023-06-21 LAB — CBC WITH DIFFERENTIAL
Basophils Absolute: 0 10*3/uL (ref 0.0–0.2)
Basos: 1 %
EOS (ABSOLUTE): 0 10*3/uL (ref 0.0–0.4)
Eos: 1 %
Hematocrit: 32.6 % — ABNORMAL LOW (ref 34.0–46.6)
Hemoglobin: 10.7 g/dL — ABNORMAL LOW (ref 11.1–15.9)
Immature Grans (Abs): 0 10*3/uL (ref 0.0–0.1)
Immature Granulocytes: 1 %
Lymphocytes Absolute: 1.5 10*3/uL (ref 0.7–3.1)
Lymphs: 35 %
MCH: 29.1 pg (ref 26.6–33.0)
MCHC: 32.8 g/dL (ref 31.5–35.7)
MCV: 89 fL (ref 79–97)
Monocytes Absolute: 0.4 10*3/uL (ref 0.1–0.9)
Monocytes: 10 %
Neutrophils Absolute: 2.3 10*3/uL (ref 1.4–7.0)
Neutrophils: 52 %
RBC: 3.68 x10E6/uL — ABNORMAL LOW (ref 3.77–5.28)
RDW: 13.1 % (ref 11.7–15.4)
WBC: 4.3 10*3/uL (ref 3.4–10.8)

## 2023-06-21 LAB — CMP14+EGFR
ALT: 20 IU/L (ref 0–32)
AST: 15 IU/L (ref 0–40)
Albumin: 4.2 g/dL (ref 3.9–4.9)
Alkaline Phosphatase: 38 IU/L — ABNORMAL LOW (ref 44–121)
BUN/Creatinine Ratio: 9 (ref 9–23)
BUN: 8 mg/dL (ref 6–24)
Bilirubin Total: 0.5 mg/dL (ref 0.0–1.2)
CO2: 21 mmol/L (ref 20–29)
Calcium: 9.6 mg/dL (ref 8.7–10.2)
Chloride: 105 mmol/L (ref 96–106)
Creatinine, Ser: 0.9 mg/dL (ref 0.57–1.00)
Globulin, Total: 3.3 g/dL (ref 1.5–4.5)
Glucose: 83 mg/dL (ref 70–99)
Potassium: 4.1 mmol/L (ref 3.5–5.2)
Sodium: 140 mmol/L (ref 134–144)
Total Protein: 7.5 g/dL (ref 6.0–8.5)
eGFR: 81 mL/min/{1.73_m2} (ref >59)

## 2023-06-21 LAB — LIPID PANEL
Chol/HDL Ratio: 4.5 ratio — ABNORMAL HIGH (ref 0.0–4.4)
Cholesterol, Total: 175 mg/dL (ref 100–199)
HDL: 39 mg/dL — ABNORMAL LOW (ref >39)
LDL Chol Calc (NIH): 119 mg/dL — ABNORMAL HIGH (ref 0–99)
Triglycerides: 92 mg/dL (ref 0–149)
VLDL Cholesterol Cal: 17 mg/dL (ref 5–40)

## 2023-06-21 LAB — HEMOGLOBIN A1C
Est. average glucose Bld gHb Est-mCnc: 97 mg/dL
Hgb A1c MFr Bld: 5 % (ref 4.8–5.6)

## 2023-06-21 LAB — TSH: TSH: 2.62 u[IU]/mL (ref 0.450–4.500)

## 2023-06-23 ENCOUNTER — Encounter: Payer: Self-pay | Admitting: Cardiology

## 2023-06-23 ENCOUNTER — Ambulatory Visit: Payer: BC Managed Care – PPO | Admitting: Cardiology

## 2023-06-23 VITALS — BP 128/84 | HR 94 | Ht 63.0 in | Wt 234.8 lb

## 2023-06-23 DIAGNOSIS — E782 Mixed hyperlipidemia: Secondary | ICD-10-CM

## 2023-06-23 DIAGNOSIS — E039 Hypothyroidism, unspecified: Secondary | ICD-10-CM | POA: Diagnosis not present

## 2023-06-23 DIAGNOSIS — D508 Other iron deficiency anemias: Secondary | ICD-10-CM | POA: Diagnosis not present

## 2023-06-23 DIAGNOSIS — I10 Essential (primary) hypertension: Secondary | ICD-10-CM | POA: Diagnosis not present

## 2023-06-23 NOTE — Progress Notes (Signed)
Established Patient Office Visit  Subjective:  Patient ID: Amanda Pearson, female    DOB: [birth year]  Age: 42 y.o. MRN: [id number]  Chief Complaint  Patient presents with   Follow-up    4 month follow up    Patient in office for 4 month follow up, discuss recent labs. Patient doing well. No complaints today. Mammogram followed by cancer center. Patient seeing Gyn for pap smear, scheduled next month.  Fasting lab work Psychiatric nurse. TSH back to normal. Patient admits to not taking the rosuvastatin as prescribed. LDL elevated. Patient agrees to restart rosuvastatin.  Blood pressure recheck improved. Continue losartan and amlodipine.     No other concerns at this time.   Past Medical History:  Diagnosis Date   Anemia    Anemia    Anxiety    Cancer (HCC)    Hypertension    IDA (iron deficiency anemia) 10/28/2021    Past Surgical History:  Procedure Laterality Date   BREAST BIOPSY Left 03/13/2021   [place] Bx, q-clip, IMC with mucinous features   BREAST BIOPSY Right 03/13/2021   [place] Bx, x-clip, fibroadenoma   BREAST LUMPECTOMY,RADIO FREQ LOCALIZER,AXILLARY SENTINEL LYMPH NODE BIOPSY Left 04/10/2021   Procedure: BREAST LUMPECTOMY,RADIO FREQ LOCALIZER,AXILLARY SENTINEL LYMPH NODE BIOPSY;  Surgeon: Campbell Lerner, MD;  Location: [facility] ORS;  Service: General;  Laterality: Left;   FACIAL COSMETIC SURGERY      Social History   Socioeconomic History   Marital status: Married    Spouse name: Amanda Pearson   Number of children: Not on file   Years of education: Not on file   Highest education level: Not on file  Occupational History   Not on file  Tobacco Use   Smoking status: Never   Smokeless tobacco: Never  Vaping Use   Vaping status: Never Used  Substance and Sexual Activity   Alcohol use: No   Drug use: No   Sexual activity: Yes  Other Topics Concern   Not on file  Social History Narrative   Not on file   Social Determinants of Health   Financial Resource Strain: Not on file   Food Insecurity: Not on file  Transportation Needs: Not on file  Physical Activity: Not on file  Stress: Not on file  Social Connections: Not on file  Intimate Partner Violence: Not on file    Family History  Problem Relation Age of Onset   COPD Mother    Kidney disease Mother    Multiple sclerosis Father    Breast cancer Paternal Aunt 28    No Known Allergies  Review of Systems  Constitutional: Negative.   HENT: Negative.    Eyes: Negative.   Respiratory: Negative.  Negative for shortness of breath.   Cardiovascular: Negative.  Negative for chest pain.  Gastrointestinal: Negative.  Negative for abdominal pain, constipation and diarrhea.  Genitourinary: Negative.   Musculoskeletal:  Negative for joint pain and myalgias.  Skin: Negative.   Neurological: Negative.  Negative for dizziness and headaches.  Endo/Heme/Allergies: Negative.   All other systems reviewed and are negative.      Objective:   BP 128/84 (BP Location: Right Arm, Patient Position: Sitting, Cuff Size: Large)   Pulse 94   Ht 5\' 3"  (1.6 m)   Wt 234 lb 12.8 oz (106.5 kg)   SpO2 96%   BMI 41.59 kg/m   Vitals:   06/23/23 0958 06/23/23 1019  BP: (!) 140/90 128/84  Pulse: 94   Height: 5\' 3"  (1.6 m)  Weight: 234 lb 12.8 oz (106.5 kg)   SpO2: 96%   BMI (Calculated): 41.6     Physical Exam Vitals and nursing note reviewed.  Constitutional:      Appearance: Normal appearance. She is normal weight.  HENT:     Head: Normocephalic and atraumatic.     Nose: Nose normal.     Mouth/Throat:     Mouth: Mucous membranes are moist.  Eyes:     Extraocular Movements: Extraocular movements intact.     Conjunctiva/sclera: Conjunctivae normal.     Pupils: Pupils are equal, round, and reactive to light.  Cardiovascular:     Rate and Rhythm: Normal rate and regular rhythm.     Pulses: Normal pulses.     Heart sounds: Normal heart sounds.  Pulmonary:     Effort: Pulmonary effort is normal.     Breath  sounds: Normal breath sounds.  Abdominal:     General: Abdomen is flat. Bowel sounds are normal.     Palpations: Abdomen is soft.  Musculoskeletal:        General: Normal range of motion.     Cervical back: Normal range of motion.  Skin:    General: Skin is warm and dry.  Neurological:     General: No focal deficit present.     Mental Status: She is alert and oriented to person, place, and time.  Psychiatric:        Mood and Affect: Mood normal.        Behavior: Behavior normal.        Thought Content: Thought content normal.        Judgment: Judgment normal.     No results found for any visits on 06/23/23.  Recent Results (from the past 2160 hour(s))  Hemoglobin A1c     Status: None   Collection Time: 06/20/23  1:30 PM  Result Value Ref Range   Hgb A1c MFr Bld 5.0 4.8 - 5.6 %    Comment:          Prediabetes: 5.7 - 6.4          Diabetes: >6.4          Glycemic control for adults with diabetes: <7.0    Est. average glucose Bld gHb Est-mCnc 97 mg/dL  TSH     Status: None   Collection Time: 06/20/23  1:30 PM  Result Value Ref Range   TSH 2.620 0.450 - 4.500 uIU/mL  CMP14+EGFR     Status: Abnormal   Collection Time: 06/20/23  1:30 PM  Result Value Ref Range   Glucose 83 70 - 99 mg/dL   BUN 8 6 - 24 mg/dL   Creatinine, Ser 1.61 0.57 - 1.00 mg/dL   eGFR 81 >09 UE/AVW/0.98   BUN/Creatinine Ratio 9 9 - 23   Sodium 140 134 - 144 mmol/L   Potassium 4.1 3.5 - 5.2 mmol/L   Chloride 105 96 - 106 mmol/L   CO2 21 20 - 29 mmol/L   Calcium 9.6 8.7 - 10.2 mg/dL   Total Protein 7.5 6.0 - 8.5 g/dL   Albumin 4.2 3.9 - 4.9 g/dL   Globulin, Total 3.3 1.5 - 4.5 g/dL   Bilirubin Total 0.5 0.0 - 1.2 mg/dL   Alkaline Phosphatase 38 (L) 44 - 121 IU/L   AST 15 0 - 40 IU/L   ALT 20 0 - 32 IU/L  Lipid panel     Status: Abnormal   Collection Time: 06/20/23  1:30 PM  Result Value Ref Range   Cholesterol, Total 175 100 - 199 mg/dL   Triglycerides 92 0 - 149 mg/dL   HDL 39 (L) >40 mg/dL    VLDL Cholesterol Cal 17 5 - 40 mg/dL   LDL Chol Calc (NIH) 981 (H) 0 - 99 mg/dL   Chol/HDL Ratio 4.5 (H) 0.0 - 4.4 ratio    Comment:                                   T. Chol/HDL Ratio                                             Men  Women                               1/2 Avg.Risk  3.4    3.3                                   Avg.Risk  5.0    4.4                                2X Avg.Risk  9.6    7.1                                3X Avg.Risk 23.4   11.0   CBC With Differential     Status: Abnormal   Collection Time: 06/20/23  1:30 PM  Result Value Ref Range   WBC 4.3 3.4 - 10.8 x10E3/uL   RBC 3.68 (L) 3.77 - 5.28 x10E6/uL   Hemoglobin 10.7 (L) 11.1 - 15.9 g/dL   Hematocrit 19.1 (L) 47.8 - 46.6 %   MCV 89 79 - 97 fL   MCH 29.1 26.6 - 33.0 pg   MCHC 32.8 31.5 - 35.7 g/dL   RDW 29.5 62.1 - 30.8 %   Neutrophils 52 Not Estab. %   Lymphs 35 Not Estab. %   Monocytes 10 Not Estab. %   Eos 1 Not Estab. %   Basos 1 Not Estab. %   Neutrophils Absolute 2.3 1.4 - 7.0 x10E3/uL   Lymphocytes Absolute 1.5 0.7 - 3.1 x10E3/uL   Monocytes Absolute 0.4 0.1 - 0.9 x10E3/uL   EOS (ABSOLUTE) 0.0 0.0 - 0.4 x10E3/uL   Basophils Absolute 0.0 0.0 - 0.2 x10E3/uL   Immature Granulocytes 1 Not Estab. %   Immature Grans (Abs) 0.0 0.0 - 0.1 x10E3/uL    Comment: **Effective July 11, 2023, profile [id number] CBC/Differential**   (No Platelet) will be made non-orderable. [facility] Offers:   [id number] CBC With Differential/Platelet       Assessment & Plan:  Continue mammograms with the cancer center. Pap smear with Dr. Lorane Gell. Restart rosuvastatin. Recheck fasting labs in 4 months.  Blood pressure controlled. Continue same medications.   Problem List Items Addressed This Visit       Cardiovascular and Mediastinum   Essential hypertension, benign - Primary   Relevant Orders   CMP14+EGFR     Endocrine   Hypothyroidism   Relevant Orders  TSH     Other   IDA (iron deficiency anemia) (Chronic)    Relevant Orders   CBC With Diff/Platelet   Mixed hyperlipidemia   Relevant Orders   Lipid Profile    Return in about 4 months (around 10/24/2023).   Total time spent: 30 minutes  Ivyana Locey, NP  06/23/2023   This document may have been prepared by Dragon Voice Recognition software and as such may include unintentional dictation errors.

## 2023-07-06 ENCOUNTER — Other Ambulatory Visit: Payer: Self-pay | Admitting: Oncology

## 2023-08-08 ENCOUNTER — Other Ambulatory Visit: Payer: Self-pay

## 2023-08-08 DIAGNOSIS — C50912 Malignant neoplasm of unspecified site of left female breast: Secondary | ICD-10-CM

## 2023-08-16 ENCOUNTER — Other Ambulatory Visit: Payer: Self-pay | Admitting: Oncology

## 2023-08-17 ENCOUNTER — Encounter: Payer: Self-pay | Admitting: Oncology

## 2023-08-17 ENCOUNTER — Other Ambulatory Visit: Payer: Self-pay | Admitting: Oncology

## 2023-08-17 ENCOUNTER — Other Ambulatory Visit: Payer: Self-pay

## 2023-08-17 MED ORDER — TAMOXIFEN CITRATE 20 MG PO TABS: 20.0000 mg | ORAL_TABLET | Freq: Every day | ORAL | 0 refills | Status: DC

## 2023-08-18 ENCOUNTER — Other Ambulatory Visit: Payer: Self-pay

## 2023-08-18 MED ORDER — LOSARTAN POTASSIUM 100 MG PO TABS: 100.0000 mg | ORAL_TABLET | Freq: Every morning | ORAL | 2 refills | Status: DC

## 2023-08-26 ENCOUNTER — Other Ambulatory Visit: Payer: Self-pay

## 2023-09-01 ENCOUNTER — Encounter: Payer: Self-pay | Admitting: Oncology

## 2023-09-01 ENCOUNTER — Inpatient Hospital Stay: Payer: BC Managed Care – PPO

## 2023-09-01 ENCOUNTER — Inpatient Hospital Stay (HOSPITAL_BASED_OUTPATIENT_CLINIC_OR_DEPARTMENT_OTHER): Payer: BC Managed Care – PPO | Admitting: Oncology

## 2023-09-01 ENCOUNTER — Inpatient Hospital Stay: Payer: BC Managed Care – PPO | Attending: Oncology

## 2023-09-01 VITALS — BP 129/81 | HR 103 | Temp 98.9°F | Wt 233.8 lb

## 2023-09-01 DIAGNOSIS — Z79810 Long term (current) use of selective estrogen receptor modulators (SERMs): Secondary | ICD-10-CM | POA: Diagnosis not present

## 2023-09-01 DIAGNOSIS — D509 Iron deficiency anemia, unspecified: Secondary | ICD-10-CM | POA: Diagnosis not present

## 2023-09-01 DIAGNOSIS — C50912 Malignant neoplasm of unspecified site of left female breast: Secondary | ICD-10-CM | POA: Insufficient documentation

## 2023-09-01 DIAGNOSIS — D508 Other iron deficiency anemias: Secondary | ICD-10-CM

## 2023-09-01 DIAGNOSIS — Z803 Family history of malignant neoplasm of breast: Secondary | ICD-10-CM | POA: Insufficient documentation

## 2023-09-01 DIAGNOSIS — E538 Deficiency of other specified B group vitamins: Secondary | ICD-10-CM | POA: Insufficient documentation

## 2023-09-01 DIAGNOSIS — Z170 Estrogen receptor positive status [ER+]: Secondary | ICD-10-CM

## 2023-09-01 DIAGNOSIS — R935 Abnormal findings on diagnostic imaging of other abdominal regions, including retroperitoneum: Secondary | ICD-10-CM

## 2023-09-01 LAB — IRON AND TIBC
Iron: 65 ug/dL (ref 28–170)
Saturation Ratios: 19 % (ref 10.4–31.8)
TIBC: 344 ug/dL (ref 250–450)
UIBC: 279 ug/dL

## 2023-09-01 LAB — CBC WITH DIFFERENTIAL (CANCER CENTER ONLY)
Abs Immature Granulocytes: 0.01 10*3/uL (ref 0.00–0.07)
Basophils Absolute: 0 10*3/uL (ref 0.0–0.1)
Basophils Relative: 1 %
Eosinophils Absolute: 0.1 10*3/uL (ref 0.0–0.5)
Eosinophils Relative: 3 %
HCT: 31.8 % — ABNORMAL LOW (ref 36.0–46.0)
Hemoglobin: 10.3 g/dL — ABNORMAL LOW (ref 12.0–15.0)
Immature Granulocytes: 0 %
Lymphocytes Relative: 26 %
Lymphs Abs: 1.1 10*3/uL (ref 0.7–4.0)
MCH: 29.5 pg (ref 26.0–34.0)
MCHC: 32.4 g/dL (ref 30.0–36.0)
MCV: 91.1 fL (ref 80.0–100.0)
Monocytes Absolute: 0.5 10*3/uL (ref 0.1–1.0)
Monocytes Relative: 11 %
Neutro Abs: 2.5 10*3/uL (ref 1.7–7.7)
Neutrophils Relative %: 59 %
Platelet Count: 288 10*3/uL (ref 150–400)
RBC: 3.49 MIL/uL — ABNORMAL LOW (ref 3.87–5.11)
RDW: 12.8 % (ref 11.5–15.5)
WBC Count: 4.2 10*3/uL (ref 4.0–10.5)
nRBC: 0 % (ref 0.0–0.2)

## 2023-09-01 LAB — CMP (CANCER CENTER ONLY)
ALT: 15 U/L (ref 0–44)
AST: 17 U/L (ref 15–41)
Albumin: 3.5 g/dL (ref 3.5–5.0)
Alkaline Phosphatase: 35 U/L — ABNORMAL LOW (ref 38–126)
Anion gap: 9 (ref 5–15)
BUN: 9 mg/dL (ref 6–20)
CO2: 23 mmol/L (ref 22–32)
Calcium: 8.8 mg/dL — ABNORMAL LOW (ref 8.9–10.3)
Chloride: 107 mmol/L (ref 98–111)
Creatinine: 0.77 mg/dL (ref 0.44–1.00)
GFR, Estimated: >60 mL/min (ref >60)
Glucose, Bld: 108 mg/dL — ABNORMAL HIGH (ref 70–99)
Potassium: 4 mmol/L (ref 3.5–5.1)
Sodium: 139 mmol/L (ref 135–145)
Total Bilirubin: 0.6 mg/dL (ref 0.3–1.2)
Total Protein: 7.5 g/dL (ref 6.5–8.1)

## 2023-09-01 LAB — PREGNANCY, URINE: Preg Test, Ur: NEGATIVE

## 2023-09-01 LAB — FERRITIN: Ferritin: 111 ng/mL (ref 11–307)

## 2023-09-01 LAB — RETIC PANEL
Immature Retic Fract: 24.5 % — ABNORMAL HIGH (ref 2.3–15.9)
RBC.: 3.53 MIL/uL — ABNORMAL LOW (ref 3.87–5.11)
Retic Count, Absolute: 72.7 10*3/uL (ref 19.0–186.0)
Retic Ct Pct: 2.1 % (ref 0.4–3.1)
Reticulocyte Hemoglobin: 30.4 pg (ref >27.9)

## 2023-09-01 MED ORDER — VITAMIN B-12 1000 MCG PO TABS: 1000.0000 ug | ORAL_TABLET | Freq: Every day | ORAL | 1 refills | Status: AC

## 2023-09-01 MED ORDER — TAMOXIFEN CITRATE 20 MG PO TABS: 20.0000 mg | ORAL_TABLET | Freq: Every day | ORAL | 1 refills | Status: DC

## 2023-09-01 MED ORDER — IRON-VITAMIN C 65-125 MG PO TABS: 1.0000 | ORAL_TABLET | ORAL | Status: DC

## 2023-09-01 NOTE — Assessment & Plan Note (Addendum)
#  Left breast pT1c pN0 breast cancer, ER/PR+, HER2/neu negative - 04/10/21  S/p left lumpectomy/SLNB- adjuvant RT Oncotype Dx 8.  Labs are reviewed and discussed with patient. Chronically elevated CA 15.3, CT in March 2024 negative for recurrence.  Continue Tamoxifen 20mg  daily.  Recommend gyn follow up for pelvic exam  Bilateral diagnostic mammogram - Oct 2024

## 2023-09-01 NOTE — Assessment & Plan Note (Signed)
I have previously referred patient to establish care with genetic counselor She reports that she has had genetic counseloring at [facility]

## 2023-09-01 NOTE — Progress Notes (Signed)
Hematology/Oncology Progress note [phone] [phone]     CHIEF COMPLAINTS/REASON FOR VISIT:  Follow up for left breast cancer.    ASSESSMENT & PLAN:   Cancer Staging  Breast cancer, stage 1, estrogen receptor positive, left (HCC) Staging form: Breast, AJCC 8th Edition - Pathologic stage from 04/10/2021: Stage IA (pT1c, pN0, cM0, G2, ER+, PR+, HER2-, Oncotype DX score: 8) - Signed by Rickard Patience, MD on 08/26/2022   Breast cancer, stage 1, estrogen receptor positive, left (HCC) #Left breast pT1c pN0 breast cancer, ER/PR+, HER2/neu negative - 04/10/21  S/p left lumpectomy/SLNB- adjuvant RT Oncotype Dx 8.  Labs are reviewed and discussed with patient. Continue Tamoxifen 20mg  daily.  Recommend gyn follow up for pelvic exam  Bilateral diagnostic mammogram - Oct 2024  Family history of breast cancer I have previously referred patient to establish care with genetic counselor She reports that she has had genetic counseloring at [facility]  IDA (iron deficiency anemia) Labs are reviewed and discussed with patient. Lab Results  Component Value Date   HGB 10.3 (L) 09/01/2023   TIBC 344 09/01/2023   IRONPCTSAT 19 09/01/2023   FERRITIN 111 09/01/2023    No need for IV Venofer, recommend Vitron C 1 tab every other day Check hemoglobinopathy evaluation   Low serum vitamin B12 Recommend B12 supplementation daily  Orders Placed This Encounter  Procedures   Cancer antigen 27.29    Standing Status:   Future    Standing Expiration Date:   08/31/2024   Cancer antigen 15-3    Standing Status:   Future    Standing Expiration Date:   08/31/2024   CBC with Differential (Cancer Center Only)    Standing Status:   Future    Standing Expiration Date:   08/31/2024   CMP (Cancer Center only)    Standing Status:   Future    Standing Expiration Date:   08/31/2024   Vitamin B12    Standing Status:   Future    Standing Expiration Date:   08/31/2024   Ferritin     Standing Status:   Future    Standing Expiration Date:   08/31/2024   Iron and TIBC    Standing Status:   Future    Standing Expiration Date:   08/31/2024   Retic Panel    Standing Status:   Future    Standing Expiration Date:   08/31/2024   6 months follow up All questions were answered. The patient knows to call the clinic with any problems, questions or concerns.  Rickard Patience, MD, PhD [facility] Hematology Oncology 09/01/2023     HISTORY OF PRESENTING ILLNESS:   Amanda Pearson is a  42 y.o.  female with PMH listed below was seen in consultation at the request of  Orson Eva, NP  for evaluation of left breast cancer Patient was seen recently by gynecology for physical. There was a mass palpated in the left breast.  Patient was recommended to proceed with diagnostic mammogram 03/09/2021, bilateral diagnostic mammogram and ultrasound showed Left breast 0.9 x 0.6 x 1.1 cm slight lobulated hypoechoic mass at 2:00 11 cm from the nipple.  Ultrasound of the left axillary is negative Right breast 1 x 0.5x 0.7 cm 9:00 15 cm from nipple Patient was recommended for 20-month follow-up for needle biopsy.  Patient opted   have biopsy of both mass.  03/13/2021, patient went biopsy of both breast masses. 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  Grade 2 ER/PR/HER-2 status is pending.  Patient was referred to establish care with oncology.  She also has an appointment with Dr. Zoila Shutter next week Patient was accompanied by her husband. She is very anxious and feels overwhelmed after receiving the news. Denies any breast pain, nipple discharge. Menarche  -22 to 42 years old She has no children. History of OCP use for about a year.  no hormone replacement therapy  LMP March 2022 Denies any previous biopsies Family history positive for paternal aunt was diagnosed with cancer in late 45s-early  65s.                     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 pT1c pN0      #05/26/2021- 07/03/2021 Adjuvant breast radiation.                                                     INTERVAL HISTORY Amanda Pearson is a 42 y.o. female who has above history reviewed by me today presents for follow up visit for management of stage I ER/PR positive, HER2 negative left breast cancer. Patient tolerates Tamoxifen 20mg  daily, manageable side effects. Patient has no new complaints.  No new breast concerns.   Review of Systems  Constitutional:  Negative for appetite change, chills, fatigue and fever.  HENT:   Negative for hearing loss and voice change.   Eyes:  Negative for eye problems.  Respiratory:  Negative for chest tightness and cough.   Cardiovascular:  Negative for chest pain.  Gastrointestinal:  Negative for abdominal distention, abdominal pain and blood in stool.  Endocrine: Positive for hot flashes.  Genitourinary:  Negative for difficulty urinating and frequency.   Musculoskeletal:  Positive for arthralgias.  Skin:  Negative for itching and rash.  Neurological:  Negative for extremity weakness.  Hematological:  Negative for adenopathy.  Psychiatric/Behavioral:  Negative for confusion. The patient is not nervous/anxious.    Left breast and axillary soreness.   MEDICAL HISTORY:  Past Medical History:  Diagnosis Date   Anemia    Anemia    Anxiety    Cancer (HCC)    Hypertension    IDA (iron deficiency anemia) 10/28/2021    SURGICAL HISTORY: Past Surgical History:  Procedure Laterality Date   BREAST BIOPSY Left 03/13/2021   [place] Bx, q-clip, IMC with mucinous features   BREAST BIOPSY Right 03/13/2021   [place] Bx, x-clip, fibroadenoma   BREAST LUMPECTOMY,RADIO FREQ LOCALIZER,AXILLARY SENTINEL LYMPH NODE BIOPSY Left 04/10/2021   Procedure: BREAST LUMPECTOMY,RADIO FREQ LOCALIZER,AXILLARY  SENTINEL LYMPH NODE BIOPSY;  Surgeon: Campbell Lerner, MD;  Location: [facility] ORS;  Service: General;  Laterality: Left;   FACIAL COSMETIC SURGERY      SOCIAL HISTORY: Social History   Socioeconomic History   Marital status: Married    Spouse name: Fredderick   Number of children: Not on file   Years of education: Not on file   Highest education level: Not on file  Occupational History   Not on file  Tobacco Use   Smoking status: Never   Smokeless tobacco: Never  Vaping Use   Vaping status: Never Used  Substance and Sexual Activity   Alcohol use: No  Drug use: No   Sexual activity: Yes  Other Topics Concern   Not on file  Social History Narrative   Not on file   Social Determinants of Health   Financial Resource Strain: Not on file  Food Insecurity: Not on file  Transportation Needs: Not on file  Physical Activity: Not on file  Stress: Not on file  Social Connections: Not on file  Intimate Partner Violence: Not on file    FAMILY HISTORY: Family History  Problem Relation Age of Onset   COPD Mother    Kidney disease Mother    Multiple sclerosis Father    Breast cancer Paternal Aunt 61    ALLERGIES:  has No Known Allergies.  MEDICATIONS:  Current Outpatient Medications  Medication Sig Dispense Refill   amLODipine (NORVASC) 5 MG tablet Take 5 mg by mouth every morning.     Cholecalciferol (VITAMIN D-3) 125 MCG (5000 UT) TABS Take 2 tablets by mouth daily.     ferrous fumarate-b12-vitamic C-folic acid (TRINSICON / FOLTRIN) capsule Take 1 capsule by mouth.     losartan (COZAAR) 100 MG tablet Take 1 tablet (100 mg total) by mouth every morning. 90 tablet 2   omeprazole (PRILOSEC) 40 MG capsule Take by mouth.     rosuvastatin (CRESTOR) 20 MG tablet Take 20 mg by mouth at bedtime.     tamoxifen (NOLVADEX) 20 MG tablet Take 1 tablet (20 mg total) by mouth daily. 30 tablet 0   No current facility-administered medications for this visit.     PHYSICAL  EXAMINATION: ECOG PERFORMANCE STATUS: 0 - Asymptomatic Vitals:   09/01/23 1357 09/01/23 1358  BP: (!) 144/82 129/81  Pulse: (!) 103   Temp: 98.9 F (37.2 C)   SpO2: 100%    Filed Weights   09/01/23 1357  Weight: 233 lb 12.8 oz (106.1 kg)    Physical Exam Constitutional:      General: She is not in acute distress. HENT:     Head: Normocephalic and atraumatic.  Eyes:     General: No scleral icterus. Cardiovascular:     Rate and Rhythm: Normal rate and regular rhythm.  Pulmonary:     Effort: Pulmonary effort is normal. No respiratory distress.     Breath sounds: No wheezing.  Abdominal:     General: Bowel sounds are normal. There is no distension.     Palpations: Abdomen is soft.  Musculoskeletal:        General: No deformity. Normal range of motion.     Cervical back: Normal range of motion and neck supple.  Skin:    General: Skin is warm and dry.  Neurological:     Mental Status: She is alert and oriented to person, place, and time. Mental status is at baseline.  Psychiatric:        Mood and Affect: Mood normal.   Breast exam was performed in seated and lying down position. Status post left breast lumpectomy.  No palpable breast mass bilaterally. No palpable axillary lymphadenopathy bilaterally.  LABORATORY DATA:  I have reviewed the data as listed    Latest Ref Rng & Units 09/01/2023    8:40 AM 06/20/2023    1:30 PM 03/01/2023    1:38 PM  CBC  WBC 4.0 - 10.5 K/uL 4.2  4.3  7.8   Hemoglobin 12.0 - 15.0 g/dL 66.4  40.3  47.4   Hematocrit 36.0 - 46.0 % 31.8  32.6  32.9   Platelets 150 - 400 K/uL 288  343       Latest Ref Rng & Units 09/01/2023    8:40 AM 06/20/2023    1:30 PM 03/01/2023    1:38 PM  CMP  Glucose 70 - 99 mg/dL 409  83  811   BUN 6 - 20 mg/dL 9  8  13    Creatinine 0.44 - 1.00 mg/dL 9.14  7.82  9.56   Sodium 135 - 145 mmol/L 139  140  136   Potassium 3.5 - 5.1 mmol/L 4.0  4.1  3.6   Chloride 98 - 111 mmol/L 107  105  107   CO2 22 - 32 mmol/L 23   21  23    Calcium 8.9 - 10.3 mg/dL 8.8  9.6  8.9   Total Protein 6.5 - 8.1 g/dL 7.5  7.5  8.1   Total Bilirubin 0.3 - 1.2 mg/dL 0.6  0.5  0.7   Alkaline Phos 38 - 126 U/L 35  38  40   AST 15 - 41 U/L 17  15  17    ALT 0 - 44 U/L 15  20  15      Iron/TIBC/Ferritin/ %Sat    Component Value Date/Time   IRON 65 09/01/2023 0840   TIBC 344 09/01/2023 0840   FERRITIN 111 09/01/2023 0840   IRONPCTSAT 19 09/01/2023 0840      RADIOGRAPHIC STUDIES: I have personally reviewed the radiological images as listed and agreed with the findings in the report.  No results found.

## 2023-09-01 NOTE — Assessment & Plan Note (Signed)
Asymmetric enlargement and heterogeneous enhancement of right ovary.  Follow up with GYN

## 2023-09-01 NOTE — Assessment & Plan Note (Signed)
Recommend B12 supplementation daily

## 2023-09-01 NOTE — Assessment & Plan Note (Addendum)
Labs are reviewed and discussed with patient. Lab Results  Component Value Date   HGB 10.3 (L) 09/01/2023   TIBC 344 09/01/2023   IRONPCTSAT 19 09/01/2023   FERRITIN 111 09/01/2023    No need for IV Venofer, recommend Vitron C 1 tab every other day Check hemoglobinopathy evaluation

## 2023-09-01 NOTE — Progress Notes (Signed)
Patient is having no new symptoms or concerns for the doctor today. She did have a chest CT scan on 03/28/2023.

## 2023-09-06 LAB — HGB FRACTIONATION CASCADE
Hgb A2: 2.5 % (ref 1.8–3.2)
Hgb A: 97.5 % (ref 96.4–98.8)
Hgb F: 0 % (ref 0.0–2.0)
Hgb S: 0 %

## 2023-09-07 ENCOUNTER — Encounter: Payer: Self-pay | Admitting: Oncology

## 2023-09-10 ENCOUNTER — Other Ambulatory Visit: Payer: Self-pay | Admitting: Oncology

## 2023-09-10 DIAGNOSIS — D500 Iron deficiency anemia secondary to blood loss (chronic): Secondary | ICD-10-CM

## 2023-09-16 ENCOUNTER — Ambulatory Visit
Admission: RE | Admit: 2023-09-16 | Discharge: 2023-09-16 | Disposition: A | Discharge status: Home / Self Care | Payer: BC Managed Care – PPO | Source: Ambulatory Visit | Attending: Surgery | Admitting: Surgery

## 2023-09-16 DIAGNOSIS — Z170 Estrogen receptor positive status [ER+]: Secondary | ICD-10-CM | POA: Insufficient documentation

## 2023-09-16 DIAGNOSIS — C50912 Malignant neoplasm of unspecified site of left female breast: Secondary | ICD-10-CM | POA: Diagnosis present

## 2023-09-27 ENCOUNTER — Ambulatory Visit: Payer: BC Managed Care – PPO | Admitting: Surgery

## 2023-10-05 NOTE — Progress Notes (Deleted)
Surgical Clinic Progress/Follow-up Note   HPI:  42 y.o. Female presents to clinic for  follow-up History of treated left breast cancer, status post lumpectomy and radiation therapy in 2022.  Its been a year since the last evaluation.  *** Patient reports some improvement/resolution of prior issues but still has some lingering sporadic tenderness of the axillary portion of the left pectoral muscle.  A friend of hers had similar surgery and talked about some stretching maneuvers/range of motion that has been helpful.  So we reviewed those today.  Otherwise she has had no worsening issues following her radiation, and she is tolerating tamoxifen well.  Review of Systems:  Constitutional: denies fever/chills  ENT: denies sore throat, hearing problems  Respiratory: denies shortness of breath, wheezing  Cardiovascular: denies chest pain, palpitations  Gastrointestinal: denies abdominal pain, N/V, or diarrhea/and bowel function as per interval history Skin: Denies any other rashes or skin discolorations as per interval history  Vital Signs:  LMP 09/16/2023    Physical Exam:  Constitutional:  -- Normal/Obese body habitus  -- Awake, alert, and oriented x3  Pulmonary:  -- Breathing non-labored at rest Gastrointestinal:  -- Soft and non-distended, non-tender. Breast: Caryl Lyn present as chaperone. -- Post-surgical incisions all well-approximated without any peri-incisional thickening, nodularity or dermal change appreciated.    There is the radiation change to the left breast, leaving the right breast significantly more supple.  However there is no suspicious dermal nodularity, skin changes, breast nodularity or dominant masses in either breast.     Musculoskeletal / Integumentary:  -- Wounds or skin discoloration: None appreciated except post-surgical incisions, left breast as noted. -- Extremities: B/L UE and LE FROM, hands and feet warm, no edema   Imaging:    CLINICAL DATA:  History of  LEFT lumpectomy with radiation treatment in 2022.   EXAM: DIGITAL DIAGNOSTIC BILATERAL MAMMOGRAM WITH TOMOSYNTHESIS AND CAD   TECHNIQUE: Bilateral digital diagnostic mammography and breast tomosynthesis was performed. The images were evaluated with computer-aided detection.   COMPARISON:  Previous exam(s).   ACR Breast Density Category b: There are scattered areas of fibroglandular density.   FINDINGS: Post operative changes are seen in the LEFT No suspicious mass, distortion, or microcalcifications are identified to suggest presence of malignancy.breast.   IMPRESSION: No mammographic evidence for malignancy.   RECOMMENDATION: Screening mammogram in one year.(Code:SM-B-01Y)   I have discussed the findings and recommendations with the patient. If applicable, a reminder letter will be sent to the patient regarding the next appointment.   BI-RADS CATEGORY  2: Benign.     Electronically Signed   By: Norva Pavlov M.D.   On: 09/16/2023 11:28  Assessment:  42 y.o. yo Female with a problem list including...  Patient Active Problem List   Diagnosis Date Noted   Low serum vitamin B12 09/01/2023   Abnormal ultrasound of ovary 09/01/2023   Essential hypertension, benign 02/21/2023   Vitamin D deficiency, unspecified 02/21/2023   Mixed hyperlipidemia 02/21/2023   Hypothyroidism 02/21/2023   IDA (iron deficiency anemia) 10/28/2021   Malignant neoplasm of upper-outer quadrant of left breast in female, estrogen receptor positive (HCC) 04/21/2021   Breast cancer, stage 1, estrogen receptor positive, left (HCC) 03/17/2021   Family history of breast cancer 03/17/2021   Anemia 07/28/2011    presents to clinic for follow-up evaluation of left breast cancer, following breast conservation treatment, progressing well.  Recent screening all benign, anticipating follow-up screening in 1 year.  Plan:              -  return to clinic annually or as needed, instructed to call office if  any questions or concerns  All of the above recommendations were discussed with the patient, and all of patient's questions were answered to her expressed satisfaction.  These notes generated with voice recognition software. I apologize for typographical errors.  Campbell Lerner, MD, FACS [place]: [facility] General [facility] - Partnering for exceptional care. Office: [phone]

## 2023-10-06 ENCOUNTER — Ambulatory Visit: Payer: BC Managed Care – PPO | Admitting: Surgery

## 2023-10-11 ENCOUNTER — Encounter: Payer: Self-pay | Admitting: Surgery

## 2023-10-11 ENCOUNTER — Ambulatory Visit (INDEPENDENT_AMBULATORY_CARE_PROVIDER_SITE_OTHER): Payer: BC Managed Care – PPO | Admitting: Surgery

## 2023-10-11 VITALS — BP 145/85 | HR 87 | Temp 99.1°F | Ht 63.0 in | Wt 229.8 lb

## 2023-10-11 DIAGNOSIS — C50912 Malignant neoplasm of unspecified site of left female breast: Secondary | ICD-10-CM

## 2023-10-11 DIAGNOSIS — Z170 Estrogen receptor positive status [ER+]: Secondary | ICD-10-CM | POA: Diagnosis not present

## 2023-10-11 NOTE — Progress Notes (Signed)
Surgical Clinic Progress/Follow-up Note   HPI:  42 y.o. Female presents to clinic for  follow-up History of treated left breast cancer, status post lumpectomy and radiation therapy in 2022.  Its been a year since the last evaluation.  Patient reports significant improvement/resolution of prior issues of tenderness of the axillary portion of the left pectoral muscle.  The stretching maneuvers/range of motion that has been helpful. Otherwise she has had no worsening issues following her radiation, and she is tolerating tamoxifen well, with some night sweats, but no complaint.  Review of Systems:  Constitutional: denies fever/chills  ENT: denies sore throat, hearing problems  Respiratory: denies shortness of breath, wheezing  Cardiovascular: denies chest pain, palpitations  Gastrointestinal: denies abdominal pain, N/V, or diarrhea/and bowel function as per interval history Skin: Denies any other rashes or skin discolorations as per interval history  Vital Signs:  BP (!) 145/85 (BP Location: Right Arm, Patient Position: Sitting, Cuff Size: Large)   Pulse 87   Temp 99.1 F (37.3 C) (Oral)   Ht 5\' 3"  (1.6 m)   Wt 229 lb 12.8 oz (104.2 kg)   LMP 09/16/2023   SpO2 99%   BMI 40.71 kg/m    Physical Exam:  Constitutional:  -- Normal/Obese body habitus  -- Awake, alert, and oriented x3  Pulmonary:  -- Breathing non-labored at rest Gastrointestinal:  -- Soft and non-distended, non-tender. Breast: Marylene Land present as chaperone. -- Post-surgical incisions all fully healed well without any peri-incisional thickening, nodularity or dermal change appreciated.    There is the radiation change to the left breast, leaving the right breast significantly more supple.  However there is no suspicious dermal nodularity, skin changes, breast nodularity or dominant masses in either breast.     Musculoskeletal / Integumentary:  -- Wounds or skin discoloration: None appreciated except post-surgical incisions,  left breast as noted. -- Extremities: B/L UE and LE FROM, hands and feet warm, no edema   Imaging:    CLINICAL DATA:  History of LEFT lumpectomy with radiation treatment in 2022.   EXAM: DIGITAL DIAGNOSTIC BILATERAL MAMMOGRAM WITH TOMOSYNTHESIS AND CAD   TECHNIQUE: Bilateral digital diagnostic mammography and breast tomosynthesis was performed. The images were evaluated with computer-aided detection.   COMPARISON:  Previous exam(s).   ACR Breast Density Category b: There are scattered areas of fibroglandular density.   FINDINGS: Post operative changes are seen in the LEFT No suspicious mass, distortion, or microcalcifications are identified to suggest presence of malignancy.breast.   IMPRESSION: No mammographic evidence for malignancy.   RECOMMENDATION: Screening mammogram in one year.(Code:SM-B-01Y)   I have discussed the findings and recommendations with the patient. If applicable, a reminder letter will be sent to the patient regarding the next appointment.   BI-RADS CATEGORY  2: Benign.     Electronically Signed   By: Norva Pavlov M.D.   On: 09/16/2023 11:28  Assessment:  42 y.o. yo Female with a problem list including...  Patient Active Problem List   Diagnosis Date Noted   Low serum vitamin B12 09/01/2023   Abnormal ultrasound of ovary 09/01/2023   Essential hypertension, benign 02/21/2023   Vitamin D deficiency, unspecified 02/21/2023   Mixed hyperlipidemia 02/21/2023   Hypothyroidism 02/21/2023   IDA (iron deficiency anemia) 10/28/2021   Malignant neoplasm of upper-outer quadrant of left breast in female, estrogen receptor positive (HCC) 04/21/2021   Breast cancer, stage 1, estrogen receptor positive, left (HCC) 03/17/2021   Family history of breast cancer 03/17/2021   Anemia 07/28/2011  presents to clinic for follow-up evaluation of left breast cancer, following breast conservation treatment, progressing well.  Recent screening all benign,  anticipating follow-up screening in 1 year.  Plan:              - return to clinic annually or as needed, instructed to call office if any questions or concerns  All of the above recommendations were discussed with the patient, and all of patient's questions were answered to her expressed satisfaction.  These notes generated with voice recognition software. I apologize for typographical errors.  Campbell Lerner, MD, FACS [place]: [facility] General [facility] - Partnering for exceptional care. Office: [phone]

## 2023-10-11 NOTE — Patient Instructions (Addendum)
If you have any concerns or questions, please feel free to call our office. Follow up in 1 year.   Breast Self-Awareness Breast self-awareness is knowing how your breasts look and feel. You need to: Check your breasts on a regular basis. Tell your doctor about any changes. Become familiar with the look and feel of your breasts. This can help you catch a breast problem while it is still small and can be treated. You should do breast self-exams even if you have breast implants. What you need: A mirror. A well-lit room. A pillow or other soft object. How to do a breast self-exam Follow these steps to do a breast self-exam: Look for changes  Take off all the clothes above your waist. Stand in front of a mirror in a room with good lighting. Put your hands down at your sides. Compare your breasts in the mirror. Look for any difference between them, such as: A difference in shape. A difference in size. Wrinkles, dips, and bumps in one breast and not the other. Look at each breast for changes in the skin, such as: Redness. Scaly areas. Skin that has gotten thicker. Dimpling. Open sores (ulcers). Look for changes in your nipples, such as: Fluid coming out of a nipple. Fluid around a nipple. Bleeding. Dimpling. Redness. A nipple that looks pushed in (retracted), or that has changed position. Feel for changes Lie on your back. Feel each breast. To do this: Pick a breast to feel. Place a pillow under the shoulder closest to that breast. Put the arm closest to that breast behind your head. Feel the nipple area of that breast using the hand of your other arm. Feel the area with the pads of your three middle fingers by making small circles with your fingers. Use light, medium, and firm pressure. Continue the overlapping circles, moving downward over the breast. Keep making circles with your fingers. Stop when you feel your ribs. Start making circles with your fingers again, this time going  upward until you reach your collarbone. Then, make circles outward across your breast and into your armpit area. Squeeze your nipple. Check for discharge and lumps. Repeat these steps to check your other breast. Sit or stand in the tub or shower. With soapy water on your skin, feel each breast the same way you did when you were lying down. Write down what you find Writing down what you find can help you remember what to tell your doctor. Write down: What is normal for each breast. Any changes you find in each breast. These include: The kind of changes you find. A tender or painful breast. Any lump you find. Write down its size and where it is. When you last had your monthly period (menstrual cycle). General tips If you are breastfeeding, the best time to check your breasts is after you feed your baby or after you use a breast pump. If you get monthly bleeding, the best time to check your breasts is 5-7 days after your monthly cycle ends. With time, you will become comfortable with the self-exam. You will also start to know if there are changes in your breasts. Contact a doctor if: You see a change in the shape or size of your breasts or nipples. You see a change in the skin of your breast or nipples, such as red or scaly skin. You have fluid coming from your nipples that is not normal. You find a new lump or thick area. You have breast pain. You  have any concerns about your breast health. Summary Breast self-awareness includes looking for changes in your breasts and feeling for changes within your breasts. You should do breast self-awareness in front of a mirror in a well-lit room. If you get monthly periods (menstrual cycles), the best time to check your breasts is 5-7 days after your period ends. Tell your doctor about any changes you see in your breasts. Changes include changes in size, changes on the skin, painful or tender breasts, or fluid from your nipples that is not  normal. This information is not intended to replace advice given to you by your health care provider. Make sure you discuss any questions you have with your health care provider. Document Revised: 05/06/2022 Document Reviewed: 10/01/2021 Elsevier Patient Education  2024 [employer].

## 2023-10-24 ENCOUNTER — Other Ambulatory Visit: Payer: Self-pay | Admitting: Cardiology

## 2023-10-24 ENCOUNTER — Ambulatory Visit: Payer: BC Managed Care – PPO | Admitting: Cardiology

## 2023-10-25 MED ORDER — AMLODIPINE BESYLATE 5 MG PO TABS: 5.0000 mg | ORAL_TABLET | Freq: Every morning | ORAL | 1 refills | Status: DC

## 2023-11-09 ENCOUNTER — Ambulatory Visit: Payer: BC Managed Care – PPO | Admitting: Cardiology

## 2023-11-24 ENCOUNTER — Other Ambulatory Visit: Payer: Self-pay | Admitting: Cardiology

## 2023-11-24 MED ORDER — ROSUVASTATIN CALCIUM 20 MG PO TABS: 20.0000 mg | ORAL_TABLET | Freq: Every day | ORAL | 1 refills | Status: DC

## 2023-12-12 ENCOUNTER — Encounter: Payer: Self-pay | Admitting: Oncology

## 2024-02-28 ENCOUNTER — Inpatient Hospital Stay: Payer: BC Managed Care – PPO | Attending: Oncology

## 2024-02-28 ENCOUNTER — Encounter: Payer: Self-pay | Admitting: Oncology

## 2024-02-28 DIAGNOSIS — D500 Iron deficiency anemia secondary to blood loss (chronic): Secondary | ICD-10-CM

## 2024-02-28 DIAGNOSIS — Z79899 Other long term (current) drug therapy: Secondary | ICD-10-CM | POA: Insufficient documentation

## 2024-02-28 DIAGNOSIS — Z170 Estrogen receptor positive status [ER+]: Secondary | ICD-10-CM | POA: Diagnosis not present

## 2024-02-28 DIAGNOSIS — C50912 Malignant neoplasm of unspecified site of left female breast: Secondary | ICD-10-CM | POA: Diagnosis present

## 2024-02-28 DIAGNOSIS — D509 Iron deficiency anemia, unspecified: Secondary | ICD-10-CM | POA: Insufficient documentation

## 2024-02-28 DIAGNOSIS — Z79810 Long term (current) use of selective estrogen receptor modulators (SERMs): Secondary | ICD-10-CM | POA: Diagnosis not present

## 2024-02-28 DIAGNOSIS — Z1721 Progesterone receptor positive status: Secondary | ICD-10-CM | POA: Insufficient documentation

## 2024-02-28 DIAGNOSIS — Z1732 Human epidermal growth factor receptor 2 negative status: Secondary | ICD-10-CM | POA: Diagnosis not present

## 2024-02-28 LAB — CBC WITH DIFFERENTIAL (CANCER CENTER ONLY)
Abs Immature Granulocytes: 0.05 10*3/uL (ref 0.00–0.07)
Basophils Absolute: 0 10*3/uL (ref 0.0–0.1)
Basophils Relative: 1 %
Eosinophils Absolute: 0.1 10*3/uL (ref 0.0–0.5)
Eosinophils Relative: 2 %
HCT: 31.6 % — ABNORMAL LOW (ref 36.0–46.0)
Hemoglobin: 9.9 g/dL — ABNORMAL LOW (ref 12.0–15.0)
Immature Granulocytes: 1 %
Lymphocytes Relative: 27 %
Lymphs Abs: 1.4 10*3/uL (ref 0.7–4.0)
MCH: 27.7 pg (ref 26.0–34.0)
MCHC: 31.3 g/dL (ref 30.0–36.0)
MCV: 88.5 fL (ref 80.0–100.0)
Monocytes Absolute: 0.5 10*3/uL (ref 0.1–1.0)
Monocytes Relative: 9 %
Neutro Abs: 3.2 10*3/uL (ref 1.7–7.7)
Neutrophils Relative %: 60 %
Platelet Count: 392 10*3/uL (ref 150–400)
RBC: 3.57 MIL/uL — ABNORMAL LOW (ref 3.87–5.11)
RDW: 14.2 % (ref 11.5–15.5)
WBC Count: 5.3 10*3/uL (ref 4.0–10.5)
nRBC: 0 % (ref 0.0–0.2)

## 2024-02-28 LAB — RETIC PANEL
Immature Retic Fract: 32 % — ABNORMAL HIGH (ref 2.3–15.9)
RBC.: 3.56 MIL/uL — ABNORMAL LOW (ref 3.87–5.11)
Retic Count, Absolute: 91.8 10*3/uL (ref 19.0–186.0)
Retic Ct Pct: 2.6 % (ref 0.4–3.1)
Reticulocyte Hemoglobin: 30.7 pg (ref >27.9)

## 2024-02-28 LAB — IRON AND TIBC
Iron: 45 ug/dL (ref 28–170)
Saturation Ratios: 13 % (ref 10.4–31.8)
TIBC: 357 ug/dL (ref 250–450)
UIBC: 312 ug/dL

## 2024-02-28 LAB — CMP (CANCER CENTER ONLY)
ALT: 12 U/L (ref 0–44)
AST: 17 U/L (ref 15–41)
Albumin: 3.4 g/dL — ABNORMAL LOW (ref 3.5–5.0)
Alkaline Phosphatase: 35 U/L — ABNORMAL LOW (ref 38–126)
Anion gap: 10 (ref 5–15)
BUN: 12 mg/dL (ref 6–20)
CO2: 22 mmol/L (ref 22–32)
Calcium: 8.7 mg/dL — ABNORMAL LOW (ref 8.9–10.3)
Chloride: 104 mmol/L (ref 98–111)
Creatinine: 0.76 mg/dL (ref 0.44–1.00)
GFR, Estimated: >60 mL/min (ref >60)
Glucose, Bld: 103 mg/dL — ABNORMAL HIGH (ref 70–99)
Potassium: 3.7 mmol/L (ref 3.5–5.1)
Sodium: 136 mmol/L (ref 135–145)
Total Bilirubin: 0.5 mg/dL (ref 0.0–1.2)
Total Protein: 7.2 g/dL (ref 6.5–8.1)

## 2024-02-28 LAB — VITAMIN B12: Vitamin B-12: 541 pg/mL (ref 180–914)

## 2024-02-28 LAB — FERRITIN: Ferritin: 82 ng/mL (ref 11–307)

## 2024-02-29 LAB — CANCER ANTIGEN 15-3: CA 15-3: 31.4 U/mL — ABNORMAL HIGH (ref 0.0–25.0)

## 2024-02-29 LAB — CANCER ANTIGEN 27.29: CA 27.29: 35.5 U/mL (ref 0.0–38.6)

## 2024-03-01 ENCOUNTER — Other Ambulatory Visit: Payer: BC Managed Care – PPO

## 2024-03-01 ENCOUNTER — Ambulatory Visit: Payer: BC Managed Care – PPO | Admitting: Oncology

## 2024-03-01 ENCOUNTER — Encounter: Payer: Self-pay | Admitting: Oncology

## 2024-03-01 ENCOUNTER — Inpatient Hospital Stay (HOSPITAL_BASED_OUTPATIENT_CLINIC_OR_DEPARTMENT_OTHER): Payer: Self-pay | Admitting: Oncology

## 2024-03-01 VITALS — BP 145/90 | HR 108 | Temp 98.9°F | Resp 18 | Wt 243.1 lb

## 2024-03-01 DIAGNOSIS — D509 Iron deficiency anemia, unspecified: Secondary | ICD-10-CM | POA: Diagnosis not present

## 2024-03-01 DIAGNOSIS — C50912 Malignant neoplasm of unspecified site of left female breast: Secondary | ICD-10-CM | POA: Diagnosis not present

## 2024-03-01 DIAGNOSIS — E538 Deficiency of other specified B group vitamins: Secondary | ICD-10-CM | POA: Diagnosis not present

## 2024-03-01 DIAGNOSIS — D508 Other iron deficiency anemias: Secondary | ICD-10-CM | POA: Diagnosis not present

## 2024-03-01 DIAGNOSIS — Z170 Estrogen receptor positive status [ER+]: Secondary | ICD-10-CM

## 2024-03-01 MED ORDER — TAMOXIFEN CITRATE 20 MG PO TABS: 20.0000 mg | ORAL_TABLET | Freq: Every day | ORAL | 1 refills | Status: DC

## 2024-03-01 NOTE — Assessment & Plan Note (Signed)
Recommend B12 supplementation daily

## 2024-03-01 NOTE — Assessment & Plan Note (Addendum)
#  Left breast pT1c pN0 breast cancer, ER/PR+, HER2/neu negative - 04/10/21  S/p left lumpectomy/SLNB- adjuvant RT Oncotype Dx 8.  Labs are reviewed and discussed with patient. Chronically elevated CA 15.3, CT in March 2024 negative for recurrence.  Continue Tamoxifen 20mg  daily.  Recommend gyn follow up for pelvic exam annually Bilateral diagnostic mammogram - Oct 2025

## 2024-03-01 NOTE — Progress Notes (Signed)
 Hematology/Oncology Progress note [phone] [phone]     CHIEF COMPLAINTS/REASON FOR VISIT:  Follow up for left breast cancer.    ASSESSMENT & PLAN:   Cancer Staging  Breast cancer, stage 1, estrogen receptor positive, left (HCC) Staging form: Breast, AJCC 8th Edition - Pathologic stage from 04/10/2021: Stage IA (pT1c, pN0, cM0, G2, ER+, PR+, HER2-, Oncotype DX score: 8) - Signed by Rickard Patience, MD on 08/26/2022   Breast cancer, stage 1, estrogen receptor positive, left (HCC) #Left breast pT1c pN0 breast cancer, ER/PR+, HER2/neu negative - 04/10/21  S/p left lumpectomy/SLNB- adjuvant RT Oncotype Dx 8.  Labs are reviewed and discussed with patient. Chronically elevated CA 15.3, CT in March 2024 negative for recurrence.  Continue Tamoxifen 20mg  daily.  Recommend gyn follow up for pelvic exam annually Bilateral diagnostic mammogram - Oct 2025  IDA (iron deficiency anemia) Labs are reviewed and discussed with patient. Lab Results  Component Value Date   HGB 9.9 (L) 02/28/2024   TIBC 357 02/28/2024   IRONPCTSAT 13 02/28/2024   FERRITIN 82 02/28/2024    No need for IV Venofer, recommend Vitron C 1 tab every other day Negative hemoglobinopathy evaluation, alpha thalassemia genotype test is pending.   Low serum vitamin B12 Recommend B12 supplementation daily  Orders Placed This Encounter  Procedures   CBC with Differential (Cancer Center Only)    Standing Status:   Future    Expected Date:   09/01/2024    Expiration Date:   03/01/2025   CMP (Cancer Center only)    Standing Status:   Future    Expected Date:   09/01/2024    Expiration Date:   03/01/2025   Vitamin B12    Standing Status:   Future    Expected Date:   09/01/2024    Expiration Date:   03/01/2025   Iron and TIBC    Standing Status:   Future    Expected Date:   09/01/2024    Expiration Date:   03/01/2025   Ferritin    Standing Status:   Future    Expected Date:   09/01/2024     Expiration Date:   03/01/2025   Retic Panel    Standing Status:   Future    Expected Date:   09/01/2024    Expiration Date:   03/01/2025   6 months follow up All questions were answered. The patient knows to call the clinic with any problems, questions or concerns.  Rickard Patience, MD, PhD [facility] Hematology Oncology 03/01/2024     HISTORY OF PRESENTING ILLNESS:   Amanda Pearson is a  43 y.o.  female with PMH listed below was seen in consultation at the request of  No ref. provider found  for evaluation of left breast cancer Patient was seen recently by gynecology for physical. There was a mass palpated in the left breast.  Patient was recommended to proceed with diagnostic mammogram 03/09/2021, bilateral diagnostic mammogram and ultrasound showed Left breast 0.9 x 0.6 x 1.1 cm slight lobulated hypoechoic mass at 2:00 11 cm from the nipple.  Ultrasound of the left axillary is negative Right breast 1 x 0.5x 0.7 cm 9:00 15 cm from nipple Patient was recommended for 17-month follow-up for needle biopsy.  Patient opted   have biopsy of both mass.  03/13/2021, patient went biopsy of both breast masses. 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  Grade 2 ER/PR/HER-2 status is  pending.  Patient was referred to establish care with oncology.  She also has an appointment with Dr. Zoila Shutter next week Patient was accompanied by her husband. She is very anxious and feels overwhelmed after receiving the news. Denies any breast pain, nipple discharge. Menarche  -40 to 43 years old She has no children. History of OCP use for about a year.  no hormone replacement therapy  LMP March 2022 Denies any previous biopsies Family history positive for paternal aunt was diagnosed with cancer in late 6s-early  25s.                    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 pT1c pN0      #05/26/2021- 07/03/2021 Adjuvant breast radiation.                                                     INTERVAL HISTORY Amanda Pearson is a 43 y.o. female who has above history reviewed by me today presents for follow up visit for management of stage I ER/PR positive, HER2 negative left breast cancer. Patient tolerates Tamoxifen 20mg  daily, manageable side effects. Patient has no new complaints.  No new breast concerns.   Review of Systems  Constitutional:  Negative for appetite change, chills, fatigue and fever.  HENT:   Negative for hearing loss and voice change.   Eyes:  Negative for eye problems.  Respiratory:  Negative for chest tightness and cough.   Cardiovascular:  Negative for chest pain.  Gastrointestinal:  Negative for abdominal distention, abdominal pain and blood in stool.  Endocrine: Positive for hot flashes.  Genitourinary:  Negative for difficulty urinating and frequency.   Musculoskeletal:  Positive for arthralgias.  Skin:  Negative for itching and rash.  Neurological:  Negative for extremity weakness.  Hematological:  Negative for adenopathy.  Psychiatric/Behavioral:  Negative for confusion. The patient is not nervous/anxious.    Left breast and axillary soreness.   MEDICAL HISTORY:  Past Medical History:  Diagnosis Date   Anemia    Anemia    Anxiety    Cancer (HCC)    Hypertension    IDA (iron deficiency anemia) 10/28/2021    SURGICAL HISTORY: Past Surgical History:  Procedure Laterality Date   BREAST BIOPSY Left 03/13/2021   [place] Bx, q-clip, IMC with mucinous features   BREAST BIOPSY Right 03/13/2021   [place] Bx, x-clip, fibroadenoma   BREAST LUMPECTOMY,RADIO FREQ LOCALIZER,AXILLARY SENTINEL LYMPH NODE BIOPSY Left 04/10/2021   Procedure: BREAST LUMPECTOMY,RADIO FREQ LOCALIZER,AXILLARY SENTINEL LYMPH NODE BIOPSY;  Surgeon: Campbell Lerner, MD;  Location: [facility] ORS;  Service: General;   Laterality: Left;   FACIAL COSMETIC SURGERY      SOCIAL HISTORY: Social History   Socioeconomic History   Marital status: Married    Spouse name: Fredderick   Number of children: Not on file   Years of education: Not on file   Highest education level: Not on file  Occupational History   Not on file  Tobacco Use   Smoking status: Never   Smokeless tobacco: Never  Vaping Use   Vaping status: Never Used  Substance and Sexual Activity   Alcohol use: No   Drug use: No   Sexual activity: Yes  Other Topics  Concern   Not on file  Social History Narrative   Not on file   Social Drivers of Health   Financial Resource Strain: Not on file  Food Insecurity: Not on file  Transportation Needs: Not on file  Physical Activity: Not on file  Stress: Not on file  Social Connections: Not on file  Intimate Partner Violence: Not on file    FAMILY HISTORY: Family History  Problem Relation Age of Onset   COPD Mother    Kidney disease Mother    Multiple sclerosis Father    Breast cancer Paternal Aunt 47    ALLERGIES:  has no known allergies.  MEDICATIONS:  Current Outpatient Medications  Medication Sig Dispense Refill   amLODipine (NORVASC) 5 MG tablet Take 1 tablet (5 mg total) by mouth every morning. 90 tablet 1   Cholecalciferol (VITAMIN D-3) 125 MCG (5000 UT) TABS Take 2 tablets by mouth daily.     cyanocobalamin (VITAMIN B12) 1000 MCG tablet Take 1 tablet (1,000 mcg total) by mouth daily. 90 tablet 1   Iron-Vitamin C 65-125 MG TABS Take 1 tablet by mouth every other day.     losartan (COZAAR) 100 MG tablet Take 1 tablet (100 mg total) by mouth every morning. 90 tablet 2   omeprazole (PRILOSEC) 40 MG capsule Take by mouth.     rosuvastatin (CRESTOR) 20 MG tablet Take 1 tablet (20 mg total) by mouth at bedtime. 90 tablet 1   tamoxifen (NOLVADEX) 20 MG tablet Take 1 tablet (20 mg total) by mouth daily. 90 tablet 1   No current facility-administered medications for this visit.      PHYSICAL EXAMINATION: ECOG PERFORMANCE STATUS: 0 - Asymptomatic Vitals:   03/01/24 1058 03/01/24 1106  BP: (!) 158/100 (!) 145/90  Pulse: (!) 108   Resp: 18   Temp: 98.9 F (37.2 C)   SpO2: 100%    Filed Weights   03/01/24 1058  Weight: 243 lb 1.6 oz (110.3 kg)    Physical Exam Constitutional:      General: She is not in acute distress. HENT:     Head: Normocephalic and atraumatic.  Eyes:     General: No scleral icterus. Cardiovascular:     Rate and Rhythm: Normal rate and regular rhythm.  Pulmonary:     Effort: Pulmonary effort is normal. No respiratory distress.     Breath sounds: No wheezing.  Abdominal:     General: Bowel sounds are normal. There is no distension.     Palpations: Abdomen is soft.  Musculoskeletal:        General: No deformity. Normal range of motion.     Cervical back: Normal range of motion and neck supple.  Skin:    General: Skin is warm and dry.  Neurological:     Mental Status: She is alert and oriented to person, place, and time. Mental status is at baseline.  Psychiatric:        Mood and Affect: Mood normal.   Breast exam was performed in seated and lying down position. Status post left breast lumpectomy.  No palpable breast mass bilaterally. No palpable axillary lymphadenopathy bilaterally.  LABORATORY DATA:  I have reviewed the data as listed    Latest Ref Rng & Units 02/28/2024    8:12 AM 09/01/2023    8:40 AM 06/20/2023    1:30 PM  CBC  WBC 4.0 - 10.5 K/uL 5.3  4.2  4.3   Hemoglobin 12.0 - 15.0 g/dL 9.9  16.1  10.7   Hematocrit 36.0 - 46.0 % 31.6  31.8  32.6   Platelets 150 - 400 K/uL 392  288        Latest Ref Rng & Units 02/28/2024    8:13 AM 09/01/2023    8:40 AM 06/20/2023    1:30 PM  CMP  Glucose 70 - 99 mg/dL 629  528  83   BUN 6 - 20 mg/dL 12  9  8    Creatinine 0.44 - 1.00 mg/dL 4.13  2.44  0.10   Sodium 135 - 145 mmol/L 136  139  140   Potassium 3.5 - 5.1 mmol/L 3.7  4.0  4.1   Chloride 98 - 111 mmol/L 104   107  105   CO2 22 - 32 mmol/L 22  23  21    Calcium 8.9 - 10.3 mg/dL 8.7  8.8  9.6   Total Protein 6.5 - 8.1 g/dL 7.2  7.5  7.5   Total Bilirubin 0.0 - 1.2 mg/dL 0.5  0.6  0.5   Alkaline Phos 38 - 126 U/L 35  35  38   AST 15 - 41 U/L 17  17  15    ALT 0 - 44 U/L 12  15  20      Iron/TIBC/Ferritin/ %Sat    Component Value Date/Time   IRON 45 02/28/2024 0812   TIBC 357 02/28/2024 0812   FERRITIN 82 02/28/2024 0812   IRONPCTSAT 13 02/28/2024 0812      RADIOGRAPHIC STUDIES: I have personally reviewed the radiological images as listed and agreed with the findings in the report.  No results found.

## 2024-03-01 NOTE — Assessment & Plan Note (Addendum)
 Labs are reviewed and discussed with patient. Lab Results  Component Value Date   HGB 9.9 (L) 02/28/2024   TIBC 357 02/28/2024   IRONPCTSAT 13 02/28/2024   FERRITIN 82 02/28/2024    No need for IV Venofer, recommend Vitron C 1 tab every other day Negative hemoglobinopathy evaluation, alpha thalassemia genotype test is pending.

## 2024-03-08 LAB — ALPHA-THALASSEMIA GENOTYPR

## 2024-04-17 ENCOUNTER — Other Ambulatory Visit: Payer: Self-pay | Admitting: Cardiology

## 2024-06-12 ENCOUNTER — Other Ambulatory Visit: Payer: Self-pay | Admitting: Cardiology

## 2024-07-15 ENCOUNTER — Other Ambulatory Visit: Payer: Self-pay | Admitting: Cardiology

## 2024-07-23 ENCOUNTER — Encounter: Payer: Self-pay | Admitting: Oncology

## 2024-07-23 ENCOUNTER — Other Ambulatory Visit

## 2024-07-23 DIAGNOSIS — I10 Essential (primary) hypertension: Secondary | ICD-10-CM

## 2024-07-23 DIAGNOSIS — E782 Mixed hyperlipidemia: Secondary | ICD-10-CM

## 2024-07-23 DIAGNOSIS — E039 Hypothyroidism, unspecified: Secondary | ICD-10-CM

## 2024-07-23 DIAGNOSIS — R7301 Impaired fasting glucose: Secondary | ICD-10-CM

## 2024-07-24 ENCOUNTER — Ambulatory Visit: Payer: Self-pay | Admitting: Cardiology

## 2024-07-24 LAB — CMP14+EGFR
ALT: 10 IU/L (ref 0–32)
AST: 14 IU/L (ref 0–40)
Albumin: 3.7 g/dL — ABNORMAL LOW (ref 3.9–4.9)
Alkaline Phosphatase: 49 IU/L (ref 44–121)
BUN/Creatinine Ratio: 11 (ref 9–23)
BUN: 9 mg/dL (ref 6–24)
Bilirubin Total: 0.3 mg/dL (ref 0.0–1.2)
CO2: 20 mmol/L (ref 20–29)
Calcium: 9.1 mg/dL (ref 8.7–10.2)
Chloride: 107 mmol/L — ABNORMAL HIGH (ref 96–106)
Creatinine, Ser: 0.81 mg/dL (ref 0.57–1.00)
Globulin, Total: 3 g/dL (ref 1.5–4.5)
Glucose: 97 mg/dL (ref 70–99)
Potassium: 4.4 mmol/L (ref 3.5–5.2)
Sodium: 140 mmol/L (ref 134–144)
Total Protein: 6.7 g/dL (ref 6.0–8.5)
eGFR: 92 mL/min/1.73 (ref >59)

## 2024-07-24 LAB — LIPID PANEL
Chol/HDL Ratio: 3 ratio (ref 0.0–4.4)
Cholesterol, Total: 144 mg/dL (ref 100–199)
HDL: 48 mg/dL (ref >39)
LDL Chol Calc (NIH): 79 mg/dL (ref 0–99)
Triglycerides: 90 mg/dL (ref 0–149)
VLDL Cholesterol Cal: 17 mg/dL (ref 5–40)

## 2024-07-24 LAB — TSH: TSH: 3.54 u[IU]/mL (ref 0.450–4.500)

## 2024-07-24 LAB — HEMOGLOBIN A1C
Est. average glucose Bld gHb Est-mCnc: 100 mg/dL
Hgb A1c MFr Bld: 5.1 % (ref 4.8–5.6)

## 2024-07-25 ENCOUNTER — Encounter: Payer: Self-pay | Admitting: Cardiology

## 2024-07-25 ENCOUNTER — Ambulatory Visit: Payer: Self-pay | Admitting: Cardiology

## 2024-07-25 VITALS — BP 118/80 | HR 104 | Ht 63.0 in | Wt 253.6 lb

## 2024-07-25 DIAGNOSIS — Z713 Dietary counseling and surveillance: Secondary | ICD-10-CM | POA: Diagnosis not present

## 2024-07-25 DIAGNOSIS — Z6841 Body Mass Index (BMI) 40.0 and over, adult: Secondary | ICD-10-CM

## 2024-07-25 DIAGNOSIS — E66813 Obesity, class 3: Secondary | ICD-10-CM | POA: Insufficient documentation

## 2024-07-25 DIAGNOSIS — E782 Mixed hyperlipidemia: Secondary | ICD-10-CM | POA: Diagnosis not present

## 2024-07-25 DIAGNOSIS — I10 Essential (primary) hypertension: Secondary | ICD-10-CM | POA: Diagnosis not present

## 2024-07-25 DIAGNOSIS — Z1211 Encounter for screening for malignant neoplasm of colon: Secondary | ICD-10-CM

## 2024-07-25 DIAGNOSIS — E039 Hypothyroidism, unspecified: Secondary | ICD-10-CM

## 2024-07-25 MED ORDER — LOSARTAN POTASSIUM 100 MG PO TABS: 100.0000 mg | ORAL_TABLET | Freq: Every morning | ORAL | 0 refills | Status: DC

## 2024-07-25 MED ORDER — ROSUVASTATIN CALCIUM 20 MG PO TABS: 20.0000 mg | ORAL_TABLET | Freq: Every day | ORAL | 1 refills | Status: AC

## 2024-07-25 MED ORDER — AMLODIPINE BESYLATE 5 MG PO TABS: 5.0000 mg | ORAL_TABLET | Freq: Every morning | ORAL | 0 refills | Status: DC

## 2024-07-25 NOTE — Progress Notes (Signed)
 Established Patient Office Visit  Subjective:  Patient ID: Amanda Pearson, female    DOB: 1981-09-09  Age: 43 y.o. MRN: [id number]  Chief Complaint  Patient presents with   Annual Exam    Annual with lab results    Patient in office for regular follow up, discuss recent lab results. Patient doing well, no complaints today.  Patient wanting to lose weight, interested in meeting with nutrition services, will send a referral.  Discussed recent lab work. LDL at goal. Hgb A1c normal. Continue same medications.  Due for colonoscopy in the next month, will send referral now.     No other concerns at this time.   Past Medical History:  Diagnosis Date   Anemia    Anemia    Anxiety    Cancer (HCC)    Hypertension    IDA (iron  deficiency anemia) 10/28/2021    Past Surgical History:  Procedure Laterality Date   BREAST BIOPSY Left 03/13/2021   US  Bx, q-clip, IMC with mucinous features   BREAST BIOPSY Right 03/13/2021   US  Bx, x-clip, fibroadenoma   BREAST LUMPECTOMY,RADIO FREQ LOCALIZER,AXILLARY SENTINEL LYMPH NODE BIOPSY Left 04/10/2021   Procedure: BREAST LUMPECTOMY,RADIO FREQ LOCALIZER,AXILLARY SENTINEL LYMPH NODE BIOPSY;  Surgeon: Lane Shope, MD;  Location: [facility] ORS;  Service: General;  Laterality: Left;   FACIAL COSMETIC SURGERY      Social History   Socioeconomic History   Marital status: Married    Spouse name: Fredderick   Number of children: Not on file   Years of education: Not on file   Highest education level: Not on file  Occupational History   Not on file  Tobacco Use   Smoking status: Never   Smokeless tobacco: Never  Vaping Use   Vaping status: Never Used  Substance and Sexual Activity   Alcohol use: No   Drug use: No   Sexual activity: Yes  Other Topics Concern   Not on file  Social History Narrative   Not on file   Social Drivers of Health   Financial Resource Strain: Not on file  Food Insecurity: Not on file  Transportation Needs: Not on  file  Physical Activity: Not on file  Stress: Not on file  Social Connections: Not on file  Intimate Partner Violence: Not on file    Family History  Problem Relation Age of Onset   COPD Mother    Kidney disease Mother    Multiple sclerosis Father    Breast cancer Paternal Aunt 37    No Known Allergies  Outpatient Medications Prior to Visit  Medication Sig   amLODipine  (NORVASC ) 5 MG tablet TAKE 1 TABLET BY MOUTH IN THE MORNING   calcium  carbonate (OS-CAL - DOSED IN MG OF ELEMENTAL CALCIUM ) 1250 (500 Ca) MG tablet Take 1 tablet by mouth daily with breakfast.   Cholecalciferol (VITAMIN D-3) 125 MCG (5000 UT) TABS Take 2 tablets by mouth daily.   cyanocobalamin  (VITAMIN B12) 1000 MCG tablet Take 1 tablet (1,000 mcg total) by mouth daily.   Iron , Ferrous Sulfate, 75 (15 Fe) MG/ML SOLN Take 75 mg by mouth every other day.   losartan  (COZAAR ) 100 MG tablet TAKE 1 TABLET BY MOUTH IN THE MORNING   rosuvastatin  (CRESTOR ) 20 MG tablet Take 1 tablet (20 mg total) by mouth at bedtime.   tamoxifen  (NOLVADEX ) 20 MG tablet Take 1 tablet (20 mg total) by mouth daily.   [DISCONTINUED] Iron -Vitamin C  65-125 MG TABS Take 1 tablet by mouth every other day. (  Patient not taking: Reported on 07/25/2024)   [DISCONTINUED] omeprazole (PRILOSEC) 40 MG capsule Take by mouth. (Patient not taking: Reported on 07/25/2024)   No facility-administered medications prior to visit.    Review of Systems  Constitutional: Negative.   HENT: Negative.    Eyes: Negative.   Respiratory: Negative.  Negative for shortness of breath.   Cardiovascular: Negative.  Negative for chest pain.  Gastrointestinal: Negative.  Negative for abdominal pain, constipation and diarrhea.  Genitourinary: Negative.   Musculoskeletal:  Negative for joint pain and myalgias.  Skin: Negative.   Neurological: Negative.  Negative for dizziness and headaches.  Endo/Heme/Allergies: Negative.   All other systems reviewed and are negative.       Objective:   BP 118/80   Pulse (!) 104   Ht 5' 3 (1.6 m)   Wt 253 lb 9.6 oz (115 kg)   SpO2 97%   BMI 44.92 kg/m   Vitals:   07/25/24 1005  BP: 118/80  Pulse: (!) 104  Height: 5' 3 (1.6 m)  Weight: 253 lb 9.6 oz (115 kg)  SpO2: 97%  BMI (Calculated): 44.93    Physical Exam Vitals and nursing note reviewed.  Constitutional:      Appearance: Normal appearance. She is normal weight.  HENT:     Head: Normocephalic and atraumatic.     Nose: Nose normal.     Mouth/Throat:     Mouth: Mucous membranes are moist.  Eyes:     Extraocular Movements: Extraocular movements intact.     Conjunctiva/sclera: Conjunctivae normal.     Pupils: Pupils are equal, round, and reactive to light.  Cardiovascular:     Rate and Rhythm: Normal rate and regular rhythm.     Pulses: Normal pulses.     Heart sounds: Normal heart sounds.  Pulmonary:     Effort: Pulmonary effort is normal.     Breath sounds: Normal breath sounds.  Abdominal:     General: Abdomen is flat. Bowel sounds are normal.     Palpations: Abdomen is soft.  Musculoskeletal:        General: Normal range of motion.     Cervical back: Normal range of motion.  Skin:    General: Skin is warm and dry.  Neurological:     General: No focal deficit present.     Mental Status: She is alert and oriented to person, place, and time.  Psychiatric:        Mood and Affect: Mood normal.        Behavior: Behavior normal.        Thought Content: Thought content normal.        Judgment: Judgment normal.      No results found for any visits on 07/25/24.  Recent Results (from the past 2160 hours)  Hemoglobin A1c     Status: None   Collection Time: 07/23/24 11:12 AM  Result Value Ref Range   Hgb A1c MFr Bld 5.1 4.8 - 5.6 %    Comment:          Prediabetes: 5.7 - 6.4          Diabetes: >6.4          Glycemic control for adults with diabetes: <7.0    Est. average glucose Bld gHb Est-mCnc 100 mg/dL  TSH     Status: None    Collection Time: 07/23/24 11:12 AM  Result Value Ref Range   TSH 3.540 0.450 - 4.500 uIU/mL  CMP14+EGFR  Status: Abnormal   Collection Time: 07/23/24 11:12 AM  Result Value Ref Range   Glucose 97 70 - 99 mg/dL   BUN 9 6 - 24 mg/dL   Creatinine, Ser 9.18 0.57 - 1.00 mg/dL   eGFR 92 >40 fO/fpw/8.26   BUN/Creatinine Ratio 11 9 - 23   Sodium 140 134 - 144 mmol/L   Potassium 4.4 3.5 - 5.2 mmol/L   Chloride 107 (H) 96 - 106 mmol/L   CO2 20 20 - 29 mmol/L   Calcium  9.1 8.7 - 10.2 mg/dL   Total Protein 6.7 6.0 - 8.5 g/dL   Albumin 3.7 (L) 3.9 - 4.9 g/dL   Globulin, Total 3.0 1.5 - 4.5 g/dL   Bilirubin Total 0.3 0.0 - 1.2 mg/dL   Alkaline Phosphatase 49 44 - 121 IU/L   AST 14 0 - 40 IU/L   ALT 10 0 - 32 IU/L  Lipid panel     Status: None   Collection Time: 07/23/24 11:12 AM  Result Value Ref Range   Cholesterol, Total 144 100 - 199 mg/dL   Triglycerides 90 0 - 149 mg/dL   HDL 48 >60 mg/dL   VLDL Cholesterol Cal 17 5 - 40 mg/dL   LDL Chol Calc (NIH) 79 0 - 99 mg/dL   Chol/HDL Ratio 3.0 0.0 - 4.4 ratio    Comment:                                   T. Chol/HDL Ratio                                             Men  Women                               1/2 Avg.Risk  3.4    3.3                                   Avg.Risk  5.0    4.4                                2X Avg.Risk  9.6    7.1                                3X Avg.Risk 23.4   11.0       Assessment & Plan:  Referral sent to GI for colonoscopy Referral sent to nutrition services Continue same medications  Problem List Items Addressed This Visit       Cardiovascular and Mediastinum   Essential hypertension, benign - Primary     Endocrine   Hypothyroidism     Other   Mixed hyperlipidemia    Return in about 6 months (around 01/25/2025) for fasting lab work prior.   Total time spent: 25 minutes  [employer], NP  07/25/2024   This document may have been prepared by Dragon Voice Recognition software and as  such may include unintentional dictation errors.

## 2024-08-02 ENCOUNTER — Encounter: Attending: Cardiology | Admitting: Dietician

## 2024-08-02 ENCOUNTER — Encounter: Payer: Self-pay | Admitting: Dietician

## 2024-08-02 DIAGNOSIS — E66813 Obesity, class 3: Secondary | ICD-10-CM | POA: Diagnosis not present

## 2024-08-02 DIAGNOSIS — Z713 Dietary counseling and surveillance: Secondary | ICD-10-CM | POA: Insufficient documentation

## 2024-08-02 DIAGNOSIS — Z6841 Body Mass Index (BMI) 40.0 and over, adult: Secondary | ICD-10-CM | POA: Diagnosis not present

## 2024-08-02 NOTE — Progress Notes (Signed)
 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 Pt reports juicing for the last year or so, usually has beets, carrots, apples, watermelon, pineapple, kale, cucumber, berries, melon, ginger Pt states they are currently trying IF for weight loss. Pt reports decline in sleep quality since diagnosis of breast cancer in 2022, states they can wake up with their mind racing, occasional night sweats. 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 Pt reports history of emotional eating, especially when stressed.   Estimated daily fluid intake: 48 oz Supplements: Iron , B12, Vit D, Calcium  Sleep: Moderate to low quality Stress / self-care: High Current average weekly physical activity: ADLs   24-Hr Dietary Recall First Meal: 2 packs of [employer] granola bars (4 total) Snack:  Second Meal: 2 soft chicken tacos, lettuce cheese, pico, grilled onions and peppers, guacamole Snack:  Third Meal: [employer] leg and thigh, mac and cheese, biscuit, Starry Snack:  Beverages: Starry, Water   NUTRITION DIAGNOSIS  NB-1.1 Food and nutrition-related knowledge deficit As related to Obesity.  As evidenced by BMI of 44.92 kg/m2, IF, high stress/stress eating, low physical  activity.   NUTRITION INTERVENTION  Nutrition education (E-1) on the following topics:  Educated patient on the two components of energy balance: Energy in (calories), and energy out (activity). Explain the role of negative energy balance in weight loss. Discussed options with patient to achieve a negative energy balance and how to best control energy in and energy out to accommodate their lifestyle.  Handouts Provided Include  AVS  Learning Style & Readiness for Change Teaching method utilized: Visual & Auditory  Demonstrated degree of understanding via: Teach Back  Barriers to learning/adherence to lifestyle change: Stress/Busy schedule  Goals Established by Pt Switch to a prenatal multivitamin in place of your supplements. Monitor your stress and take note of when you find yourself highly stressed. We will revisit these instances to see what we can do to prevent your stress from reaching high levels. 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 When juicing, make the amount of fruit you use no more than 1 cup (fist size). Add 1-2 Tbsp of Spirulina to your juice for extra protein and micronutrients.   MONITORING & EVALUATION Dietary intake, weekly physical activity, stress, and weight change in 2 months.  Next Steps  Patient is to PRN.

## 2024-08-02 NOTE — Patient Instructions (Addendum)
 Switch to a prenatal multivitamin in place of your supplements.  Monitor your stress and take note of when you find yourself highly stressed. We will revisit these instances to see what we can do to prevent your stress from reaching high levels.  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  When juicing, make the amount of fruit you use no more than 1 cup (fist size). Add 1-2 Tbsp of Spirulina to your juice for extra protein and micronutrients.

## 2024-08-21 ENCOUNTER — Other Ambulatory Visit: Payer: Self-pay

## 2024-08-21 ENCOUNTER — Encounter: Payer: Self-pay | Admitting: Oncology

## 2024-08-21 DIAGNOSIS — Z1231 Encounter for screening mammogram for malignant neoplasm of breast: Secondary | ICD-10-CM

## 2024-08-26 ENCOUNTER — Other Ambulatory Visit: Payer: Self-pay | Admitting: Cardiology

## 2024-09-04 ENCOUNTER — Inpatient Hospital Stay: Attending: Oncology

## 2024-09-04 DIAGNOSIS — Z79810 Long term (current) use of selective estrogen receptor modulators (SERMs): Secondary | ICD-10-CM | POA: Insufficient documentation

## 2024-09-04 DIAGNOSIS — Z170 Estrogen receptor positive status [ER+]: Secondary | ICD-10-CM | POA: Diagnosis not present

## 2024-09-04 DIAGNOSIS — C50912 Malignant neoplasm of unspecified site of left female breast: Secondary | ICD-10-CM | POA: Diagnosis not present

## 2024-09-04 DIAGNOSIS — D509 Iron deficiency anemia, unspecified: Secondary | ICD-10-CM | POA: Insufficient documentation

## 2024-09-04 LAB — CBC WITH DIFFERENTIAL (CANCER CENTER ONLY)
Abs Immature Granulocytes: 0.02 K/uL (ref 0.00–0.07)
Basophils Absolute: 0 K/uL (ref 0.0–0.1)
Basophils Relative: 0 %
Eosinophils Absolute: 0.1 K/uL (ref 0.0–0.5)
Eosinophils Relative: 3 %
HCT: 30.9 % — ABNORMAL LOW (ref 36.0–46.0)
Hemoglobin: 9.5 g/dL — ABNORMAL LOW (ref 12.0–15.0)
Immature Granulocytes: 0 %
Lymphocytes Relative: 26 %
Lymphs Abs: 1.3 K/uL (ref 0.7–4.0)
MCH: 26.2 pg (ref 26.0–34.0)
MCHC: 30.7 g/dL (ref 30.0–36.0)
MCV: 85.4 fL (ref 80.0–100.0)
Monocytes Absolute: 0.5 K/uL (ref 0.1–1.0)
Monocytes Relative: 10 %
Neutro Abs: 3 K/uL (ref 1.7–7.7)
Neutrophils Relative %: 61 %
Platelet Count: 327 K/uL (ref 150–400)
RBC: 3.62 MIL/uL — ABNORMAL LOW (ref 3.87–5.11)
RDW: 13.9 % (ref 11.5–15.5)
WBC Count: 5 K/uL (ref 4.0–10.5)
nRBC: 0 % (ref 0.0–0.2)

## 2024-09-04 LAB — CMP (CANCER CENTER ONLY)
ALT: 14 U/L (ref 0–44)
AST: 20 U/L (ref 15–41)
Albumin: 3.5 g/dL (ref 3.5–5.0)
Alkaline Phosphatase: 37 U/L — ABNORMAL LOW (ref 38–126)
Anion gap: 7 (ref 5–15)
BUN: 10 mg/dL (ref 6–20)
CO2: 22 mmol/L (ref 22–32)
Calcium: 8.7 mg/dL — ABNORMAL LOW (ref 8.9–10.3)
Chloride: 108 mmol/L (ref 98–111)
Creatinine: 0.8 mg/dL (ref 0.44–1.00)
GFR, Estimated: >60 mL/min (ref >60)
Glucose, Bld: 107 mg/dL — ABNORMAL HIGH (ref 70–99)
Potassium: 3.8 mmol/L (ref 3.5–5.1)
Sodium: 137 mmol/L (ref 135–145)
Total Bilirubin: 0.5 mg/dL (ref 0.0–1.2)
Total Protein: 7.2 g/dL (ref 6.5–8.1)

## 2024-09-04 LAB — IRON AND TIBC
Iron: 20 ug/dL — ABNORMAL LOW (ref 28–170)
Saturation Ratios: 6 % — ABNORMAL LOW (ref 10.4–31.8)
TIBC: 358 ug/dL (ref 250–450)
UIBC: 338 ug/dL

## 2024-09-04 LAB — VITAMIN B12: Vitamin B-12: 1227 pg/mL — ABNORMAL HIGH (ref 180–914)

## 2024-09-04 LAB — RETIC PANEL
Immature Retic Fract: 30.6 % — ABNORMAL HIGH (ref 2.3–15.9)
RBC.: 3.61 MIL/uL — ABNORMAL LOW (ref 3.87–5.11)
Retic Count, Absolute: 65.3 K/uL (ref 19.0–186.0)
Retic Ct Pct: 1.8 % (ref 0.4–3.1)
Reticulocyte Hemoglobin: 28.9 pg (ref >27.9)

## 2024-09-04 LAB — FERRITIN: Ferritin: 46 ng/mL (ref 11–307)

## 2024-09-05 ENCOUNTER — Other Ambulatory Visit: Payer: Self-pay | Admitting: Oncology

## 2024-09-06 ENCOUNTER — Inpatient Hospital Stay: Admitting: Oncology

## 2024-09-06 ENCOUNTER — Encounter: Payer: Self-pay | Admitting: Oncology

## 2024-09-06 ENCOUNTER — Inpatient Hospital Stay (HOSPITAL_BASED_OUTPATIENT_CLINIC_OR_DEPARTMENT_OTHER): Admitting: Oncology

## 2024-09-06 DIAGNOSIS — Z170 Estrogen receptor positive status [ER+]: Secondary | ICD-10-CM | POA: Diagnosis not present

## 2024-09-06 DIAGNOSIS — E538 Deficiency of other specified B group vitamins: Secondary | ICD-10-CM | POA: Diagnosis not present

## 2024-09-06 DIAGNOSIS — C50912 Malignant neoplasm of unspecified site of left female breast: Secondary | ICD-10-CM

## 2024-09-06 DIAGNOSIS — D508 Other iron deficiency anemias: Secondary | ICD-10-CM

## 2024-09-06 DIAGNOSIS — D509 Iron deficiency anemia, unspecified: Secondary | ICD-10-CM | POA: Diagnosis not present

## 2024-09-06 NOTE — Assessment & Plan Note (Signed)
 B12 has improved.  She may stop B12 supplementation.

## 2024-09-06 NOTE — Progress Notes (Signed)
 HEMATOLOGY-ONCOLOGY TeleHEALTH VISIT PROGRESS NOTE  I connected with Amanda Pearson on 09/06/24  at  3:00 PM EDT by video enabled telemedicine visit and verified that I am speaking with the correct person using two identifiers. I discussed the limitations, risks, security and privacy concerns of performing an evaluation and management service by telemedicine and the availability of in-person appointments. The patient expressed understanding and agreed to proceed.   Other persons participating in the visit and their role in the encounter:  None  Patient's location: At work Provider's location: office Chief Complaint:    INTERVAL HISTORY Amanda Pearson is a 43 y.o. female who has above history reviewed by me today presents for follow up visit for management of iron  deficiency anemia, history of stage I breast cancer, B12 deficiency. Patient reports feeling well today.  She takes tamoxifen  and tolerates well. She takes oral iron  supplementation every other day.   Review of Systems  Constitutional:  Positive for fatigue. Negative for appetite change, chills and fever.  HENT:   Negative for hearing loss and voice change.   Eyes:  Negative for eye problems.  Respiratory:  Negative for chest tightness and cough.   Cardiovascular:  Negative for chest pain.  Gastrointestinal:  Negative for abdominal distention, abdominal pain and blood in stool.  Endocrine: Negative for hot flashes.  Genitourinary:  Positive for menstrual problem. Negative for difficulty urinating and frequency.   Musculoskeletal:  Negative for arthralgias.  Skin:  Negative for itching and rash.  Neurological:  Negative for extremity weakness.  Hematological:  Negative for adenopathy.  Psychiatric/Behavioral:  Negative for confusion.     Past Medical History:  Diagnosis Date   Anemia    Anemia    Anxiety    Cancer (HCC)    Hypertension    IDA (iron  deficiency anemia) 10/28/2021   Past Surgical History:  Procedure  Laterality Date   BREAST BIOPSY Left 03/13/2021   US  Bx, q-clip, IMC with mucinous features   BREAST BIOPSY Right 03/13/2021   US  Bx, x-clip, fibroadenoma   BREAST LUMPECTOMY,RADIO FREQ LOCALIZER,AXILLARY SENTINEL LYMPH NODE BIOPSY Left 04/10/2021   Procedure: BREAST LUMPECTOMY,RADIO FREQ LOCALIZER,AXILLARY SENTINEL LYMPH NODE BIOPSY;  Surgeon: Lane Shope, MD;  Location: [facility] ORS;  Service: General;  Laterality: Left;   FACIAL COSMETIC SURGERY      Family History  Problem Relation Age of Onset   COPD Mother    Kidney disease Mother    Multiple sclerosis Father    Breast cancer Paternal Aunt 18    Social History   Socioeconomic History   Marital status: Married    Spouse name: Fredderick   Number of children: Not on file   Years of education: Not on file   Highest education level: Not on file  Occupational History   Not on file  Tobacco Use   Smoking status: Never   Smokeless tobacco: Never  Vaping Use   Vaping status: Never Used  Substance and Sexual Activity   Alcohol use: No   Drug use: No   Sexual activity: Yes  Other Topics Concern   Not on file  Social History Narrative   Not on file   Social Drivers of Health   Financial Resource Strain: Not on file  Food Insecurity: Not on file  Transportation Needs: Not on file  Physical Activity: Not on file  Stress: Not on file  Social Connections: Not on file  Intimate Partner Violence: Not on file    Current Outpatient Medications on File Prior  to Visit  Medication Sig Dispense Refill   amLODipine  (NORVASC ) 5 MG tablet Take 1 tablet (5 mg total) by mouth every morning. 90 tablet 0   calcium  carbonate (OS-CAL - DOSED IN MG OF ELEMENTAL CALCIUM ) 1250 (500 Ca) MG tablet Take 1 tablet by mouth daily with breakfast.     Cholecalciferol (VITAMIN D-3) 125 MCG (5000 UT) TABS Take 2 tablets by mouth daily.     cyanocobalamin  (VITAMIN B12) 1000 MCG tablet Take 1 tablet (1,000 mcg total) by mouth daily. 90 tablet 1    Iron , Ferrous Sulfate, 75 (15 Fe) MG/ML SOLN Take 75 mg by mouth every other day.     losartan  (COZAAR ) 100 MG tablet TAKE 1 TABLET BY MOUTH IN THE MORNING 30 tablet 0   rosuvastatin  (CRESTOR ) 20 MG tablet Take 1 tablet (20 mg total) by mouth at bedtime. 90 tablet 1   tamoxifen  (NOLVADEX ) 20 MG tablet Take 1 tablet by mouth once daily 90 tablet 0   No current facility-administered medications on file prior to visit.    No Known Allergies     Observations/Objective: There were no vitals filed for this visit. There is no height or weight on file to calculate BMI.  Physical Exam Neurological:     Mental Status: She is alert.     CBC    Component Value Date/Time   WBC 5.0 09/04/2024 0804   WBC 7.8 03/01/2023 1338   RBC 3.61 (L) 09/04/2024 0804   RBC 3.62 (L) 09/04/2024 0804   HGB 9.5 (L) 09/04/2024 0804   HGB 10.7 (L) 06/20/2023 1330   HCT 30.9 (L) 09/04/2024 0804   HCT 32.6 (L) 06/20/2023 1330   PLT 327 09/04/2024 0804   PLT 302 02/11/2023 0834   MCV 85.4 09/04/2024 0804   MCV 89 06/20/2023 1330   MCH 26.2 09/04/2024 0804   MCHC 30.7 09/04/2024 0804   RDW 13.9 09/04/2024 0804   RDW 13.1 06/20/2023 1330   LYMPHSABS 1.3 09/04/2024 0804   LYMPHSABS 1.5 06/20/2023 1330   MONOABS 0.5 09/04/2024 0804   EOSABS 0.1 09/04/2024 0804   EOSABS 0.0 06/20/2023 1330   BASOSABS 0.0 09/04/2024 0804   BASOSABS 0.0 06/20/2023 1330    CMP     Component Value Date/Time   NA 137 09/04/2024 0804   NA 140 07/23/2024 1112   K 3.8 09/04/2024 0804   CL 108 09/04/2024 0804   CO2 22 09/04/2024 0804   GLUCOSE 107 (H) 09/04/2024 0804   BUN 10 09/04/2024 0804   BUN 9 07/23/2024 1112   CREATININE 0.80 09/04/2024 0804   CALCIUM  8.7 (L) 09/04/2024 9195   PROT 7.2 09/04/2024 0804   PROT 6.7 07/23/2024 1112   ALBUMIN 3.5 09/04/2024 0804   ALBUMIN 3.7 (L) 07/23/2024 1112   AST 20 09/04/2024 0804   ALT 14 09/04/2024 0804   ALKPHOS 37 (L) 09/04/2024 0804   BILITOT 0.5 09/04/2024 0804    GFRNONAA >60 09/04/2024 0804   GFRAA >60 10/16/2017 1557     ASSESSMENT & PLAN:   Breast cancer, stage 1, estrogen receptor positive, left (HCC) #Left breast pT1c pN0 breast cancer, ER/PR+, HER2/neu negative - 04/10/21  S/p left lumpectomy/SLNB- adjuvant RT Oncotype Dx 8.  Labs are reviewed and discussed with patient. Chronically elevated CA 15.3, CT in March 2024 negative for recurrence.  Continue Tamoxifen  20mg  daily.  Recommend gyn follow up for pelvic exam annually Bilateral diagnostic mammogram - Oct 2025  IDA (iron  deficiency anemia) Labs are reviewed and discussed  with patient. Lab Results  Component Value Date   HGB 9.5 (L) 09/04/2024   TIBC 358 09/04/2024   IRONPCTSAT 6 (L) 09/04/2024   FERRITIN 46 09/04/2024   Discussed about IV Venofer  treatment options.  Patient prefers oral iron  supplementation. recommend Vitron C 1 tab daily and reevaluate in 3 months. Negative hemoglobinopathy evaluation, alpha thalassemia genotype test is negative.    Low serum vitamin B12 B12 has improved.  She may stop B12 supplementation.  Orders Placed This Encounter  Procedures   CBC (Cancer Center Only)    Standing Status:   Future    Expected Date:   03/06/2025    Expiration Date:   06/04/2025   CMP (Cancer Center only)    Standing Status:   Future    Expected Date:   03/06/2025    Expiration Date:   06/04/2025   Iron  and TIBC    Standing Status:   Future    Expected Date:   03/06/2025    Expiration Date:   06/04/2025   Ferritin    Standing Status:   Future    Expected Date:   03/06/2025    Expiration Date:   06/04/2025   Vitamin B12    Standing Status:   Future    Expected Date:   03/06/2025    Expiration Date:   06/04/2025    I discussed the assessment and treatment plan with the patient. The patient was provided an opportunity to ask questions and all were answered. The patient agreed with the plan and demonstrated an understanding of the instructions.  The patient was advised  to call back or seek an in-person evaluation if the symptoms worsen or if the condition fails to improve as anticipated.   I provided 25 minutes of face-to-face video visit time during this encounter, and > 50% was spent counseling as documented under my assessment & plan.   Zelphia Cap, MD 09/06/2024 11:20 PM

## 2024-09-06 NOTE — Assessment & Plan Note (Addendum)
 Labs are reviewed and discussed with patient. Lab Results  Component Value Date   HGB 9.5 (L) 09/04/2024   TIBC 358 09/04/2024   IRONPCTSAT 6 (L) 09/04/2024   FERRITIN 46 09/04/2024   Discussed about IV Venofer  treatment options.  Patient prefers oral iron  supplementation. recommend Vitron C 1 tab daily and reevaluate in 3 months. Negative hemoglobinopathy evaluation, alpha thalassemia genotype test is negative.

## 2024-09-06 NOTE — Assessment & Plan Note (Signed)
#  Left breast pT1c pN0 breast cancer, ER/PR+, HER2/neu negative - 04/10/21  S/p left lumpectomy/SLNB- adjuvant RT Oncotype Dx 8.  Labs are reviewed and discussed with patient. Chronically elevated CA 15.3, CT in March 2024 negative for recurrence.  Continue Tamoxifen 20mg  daily.  Recommend gyn follow up for pelvic exam annually Bilateral diagnostic mammogram - Oct 2025

## 2024-09-06 NOTE — Assessment & Plan Note (Deleted)
#  Left breast pT1c pN0 breast cancer, ER/PR+, HER2/neu negative - 04/10/21  S/p left lumpectomy/SLNB- adjuvant RT Oncotype Dx 8.  Labs are reviewed and discussed with patient. Chronically elevated CA 15.3, CT in March 2024 negative for recurrence.  Continue Tamoxifen 20mg  daily.  Recommend gyn follow up for pelvic exam annually Bilateral diagnostic mammogram - Oct 2025

## 2024-09-17 ENCOUNTER — Encounter

## 2024-09-25 ENCOUNTER — Ambulatory Visit: Admitting: Surgery

## 2024-09-26 ENCOUNTER — Other Ambulatory Visit: Payer: Self-pay

## 2024-09-26 ENCOUNTER — Encounter: Payer: Self-pay | Admitting: Dietician

## 2024-09-26 ENCOUNTER — Encounter: Attending: Cardiology | Admitting: Dietician

## 2024-09-26 VITALS — Ht 63.0 in | Wt 250.3 lb

## 2024-09-26 DIAGNOSIS — Z6841 Body Mass Index (BMI) 40.0 and over, adult: Secondary | ICD-10-CM | POA: Insufficient documentation

## 2024-09-26 DIAGNOSIS — E66813 Obesity, class 3: Secondary | ICD-10-CM | POA: Insufficient documentation

## 2024-09-26 MED ORDER — LOSARTAN POTASSIUM 100 MG PO TABS: 100.0000 mg | ORAL_TABLET | Freq: Every morning | ORAL | 0 refills | Status: DC

## 2024-09-26 NOTE — Patient Instructions (Addendum)
 Keep your weighted hula hoop visible in your house in an area that you walk through frequently.  Continue to remind yourself that you matter and to prioritize self care!!

## 2024-09-26 NOTE — Progress Notes (Unsigned)
 Medical Nutrition Therapy  Appointment Start time:  1615  Appointment End time:  1645  Primary concerns today: Weight Loss  Referral diagnosis: E66.813 - Class 3 Obesity, Z71.3 - Weight loss counseling Preferred learning style: No preference indicated Learning readiness: Contemplating   NUTRITION ASSESSMENT   Anthropometrics: Ht: 63 Wt: 250.3 lbs BMI: 44.34 kg/m2 Wt Change: -3 lbs x8 weeks  Clinical Medical Hx: Obesity, HTN, HLD, IDA, Hypothyroid, Breast cancer Medications: Amlodipine , Rosuvastatin , Losartan , Tamoxifen  Labs (09/04/2024): Iron  - 20, Saturation ratios - 6, RBC - 3.62, Hemoglobin - 9.5, HCT - 30.9, Glucose - 107, Alk Phos - 37, Calcium  - 8.7 Notable Signs/Symptoms: N/A   Lifestyle & Dietary Hx Pt reports desire to lose weight and get off of medication, reports a history of DM in their family and wants to avoid developing it. Pt reports juicing for the last year or so, usually has beets, carrots, apples, watermelon, pineapple, kale, cucumber, berries, melon, ginger Pt states they are currently trying IF for weight loss. Pt reports decline in sleep quality since diagnosis of breast cancer in 2022, states they can wake up with their mind racing, occasional night sweats. 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 Pt reports history of emotional eating, especially when stressed.     Pt reports being much more active at work this year, students require more activity, reports feeling a little more energy after their work day Pt reports trying to exercise with weighted hula hoop more as well.  Pt reports making some dietary changes initially after last visit, but has not been able to sustain them due to recurring stress.  Pt reports taking more time to treat themself (mani/pedi, shopping)   Pt reports not fasting much anymore, trying  to eat something small for breakfast and juicing less, eating more salads     Estimated daily fluid intake: 48 oz Supplements: Iron , B12, Vit D, Calcium  Sleep: Moderate to low quality Stress / self-care: High Current average weekly physical activity: ADLs   24-Hr Dietary Recall First Meal: 2 packs of [employer] granola bars (4 total) Snack:  Second Meal: 2 soft chicken tacos, lettuce cheese, pico, grilled onions and peppers, guacamole Snack:  Third Meal: [employer] leg and thigh, mac and cheese, biscuit, Starry Snack:  Beverages: Starry, Water   NUTRITION DIAGNOSIS  NB-1.1 Food and nutrition-related knowledge deficit As related to Obesity.  As evidenced by BMI of 44.92 kg/m2, IF, high stress/stress eating, low physical activity.   NUTRITION INTERVENTION  Nutrition education (E-1) on the following topics:  Educated patient on the two components of energy balance: Energy in (calories), and energy out (activity). Explain the role of negative energy balance in weight loss. Discussed options with patient to achieve a negative energy balance and how to best control energy in and energy out to accommodate their lifestyle.  Handouts Provided Include  AVS  Learning Style & Readiness for Change Teaching method utilized: Visual & Auditory  Demonstrated degree of understanding via: Teach Back  Barriers to learning/adherence to lifestyle change: Stress/Busy schedule  Goals Established by Pt Switch to a prenatal multivitamin in place of your supplements. Monitor your stress and take note of when you find yourself highly stressed. We will revisit these instances to see what we can do to prevent your stress from reaching high levels. 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 When juicing, make the amount of fruit you use no more than 1 cup (fist size). Add 1-2 Tbsp of  Spirulina to your juice for extra protein and micronutrients.   MONITORING & EVALUATION Dietary intake, weekly physical activity, stress, and weight change in 6 weeks  Next Steps  Patient is to PRN.

## 2024-10-11 ENCOUNTER — Ambulatory Visit
Admission: RE | Admit: 2024-10-11 | Discharge: 2024-10-11 | Disposition: A | Discharge status: Home / Self Care | Source: Ambulatory Visit | Attending: Surgery | Admitting: Surgery

## 2024-10-11 DIAGNOSIS — Z1231 Encounter for screening mammogram for malignant neoplasm of breast: Secondary | ICD-10-CM | POA: Diagnosis present

## 2024-10-19 ENCOUNTER — Ambulatory Visit

## 2024-10-19 DIAGNOSIS — K5730 Diverticulosis of large intestine without perforation or abscess without bleeding: Secondary | ICD-10-CM | POA: Diagnosis not present

## 2024-10-19 DIAGNOSIS — Z1211 Encounter for screening for malignant neoplasm of colon: Secondary | ICD-10-CM | POA: Diagnosis present

## 2024-10-23 ENCOUNTER — Ambulatory Visit (INDEPENDENT_AMBULATORY_CARE_PROVIDER_SITE_OTHER): Admitting: General Surgery

## 2024-10-23 ENCOUNTER — Encounter: Payer: Self-pay | Admitting: General Surgery

## 2024-10-23 VITALS — BP 156/87 | HR 89 | Ht 63.0 in | Wt 246.0 lb

## 2024-10-23 DIAGNOSIS — Z853 Personal history of malignant neoplasm of breast: Secondary | ICD-10-CM

## 2024-10-23 DIAGNOSIS — C50912 Malignant neoplasm of unspecified site of left female breast: Secondary | ICD-10-CM

## 2024-10-23 NOTE — Patient Instructions (Signed)
 We will see you back in 1 year for a follow up mammogram and to see Dr Marinda. Our schedule is not yet available so we placed you in our recall system and will send you out a letter with your appointment information.    How to Do a Breast Self-Exam Doing breast self-exams can help you stay healthy. They're one way to know what's normal for your breasts. They can help you catch a problem while it's still small and can be treated. You need to: Check your breasts often. Tell your doctor about any changes. You should do breast self-exams even if you have breast implants. What you need: A mirror. A well-lit room. A pillow or other soft object. How to do a breast self-exam Look for changes  Take off all the clothes above your waist. Stand in front of a mirror in a room with good lighting. Put your hands down at your sides. Compare your breasts in the mirror. Look for difference between them, such as: Differences in shape. Differences in size. Wrinkles, dips, and bumps in one breast and not the other. Look at each breast for skin changes, such as: Redness. Scaly spots. Spots where your skin is thicker. Dimpling. Open sores. Look for changes in your nipples, such as: Fluid coming out of a nipple. Fluid around a nipple. Bleeding. Dimpling. Redness. A nipple that looks pushed in or that has changed position. Feel for changes Lie on your back. Feel each breast. To do this: Pick a breast to feel. Place a pillow under the shoulder closest to that breast. Put the arm closest to that breast behind your head. Feel the breast using the hand of your other arm. Use the pads of your three middle fingers to make small circles starting near the nipple. Use light, medium, and firm pressure. Keep making circles, moving down over the breast. Stop when you feel your ribs. Start making circles with your fingers again, this time going up until you reach your collarbone. Then, make circles out across  your breast and into your armpit area. Squeeze your nipple. Check for fluid and lumps. Do these steps again to check your other breast. Sit or stand in the tub or shower. With soapy water on your skin, feel each breast the same way you did when you were lying down. Write down what you find Writing down what you find can help you keep track of what you want to tell your doctor. Write down: What's normal for each breast. Any changes you find. Write down: The kind of change. If your breast feels tender or painful. Any lump you find. Write down its size and where it is. When you last had your period. General tips If you're breastfeeding, the best time to check your breasts is after you feed your baby or after you use a breast pump. If you get a period, the best time to check your breasts is 5-7 days after your period ends. With time, you'll get more used to doing the self-exam. You'll also start to know if there are changes in your breasts. Contact a doctor if: You see a change in the shape or size of your breasts or nipples. You see a change in the skin of your breast or nipples. You have fluid coming from your nipples that isn't normal. You find a new lump or thick area. You have breast pain. You have any concerns about your breast health. This information is not intended to replace advice given  to you by your health care provider. Make sure you discuss any questions you have with your health care provider. Document Revised: 02/08/2024 Document Reviewed: 02/08/2024 Elsevier Patient Education  2025 [employer].

## 2024-10-23 NOTE — Progress Notes (Signed)
 Outpatient Surgical Follow Up  10/23/2024  Amanda Pearson is an 43 y.o. female.   Chief Complaint  Patient presents with   Follow-up    HPI: 43 y.o. Female presents to clinic for  follow-up History of treated left breast cancer, status post lumpectomy and radiation therapy in 2022.  Its been a year since the last evaluation.   She reports doing well. She sometimes has some tightness in left axilla but when she stretches it has no problems. Denies lumps or masses, overlying skin changes or discharge from nipple.   Past Medical History:  Diagnosis Date   Anemia    Anemia    Anxiety    Cancer (HCC)    Hypertension    IDA (iron  deficiency anemia) 10/28/2021    Past Surgical History:  Procedure Laterality Date   BREAST BIOPSY Left 03/13/2021   US  Bx, q-clip, IMC with mucinous features   BREAST BIOPSY Right 03/13/2021   US  Bx, x-clip, fibroadenoma   BREAST LUMPECTOMY,RADIO FREQ LOCALIZER,AXILLARY SENTINEL LYMPH NODE BIOPSY Left 04/10/2021   Procedure: BREAST LUMPECTOMY,RADIO FREQ LOCALIZER,AXILLARY SENTINEL LYMPH NODE BIOPSY;  Surgeon: Lane Shope, MD;  Location: [facility] ORS;  Service: General;  Laterality: Left;   FACIAL COSMETIC SURGERY      Family History  Problem Relation Age of Onset   COPD Mother    Kidney disease Mother    Multiple sclerosis Father    Breast cancer Paternal Aunt 34    Social History:  reports that she has never smoked. She has never used smokeless tobacco. She reports that she does not drink alcohol and does not use drugs.  Allergies: No Known Allergies  Medications reviewed.    ROS Full ROS performed and is otherwise negative other than what is stated in HPI   BP (!) 156/87   Pulse 89   Ht 5' 3 (1.6 m)   Wt 246 lb (111.6 kg)   LMP 09/20/2024   SpO2 98%   BMI 43.58 kg/m   Physical Exam Constitutional:  -- Normal/Obese body habitus  -- Awake, alert, and oriented x3  Pulmonary:  -- Breathing non-labored at rest Gastrointestinal:   -- Soft and non-distended, non-tender. Breast: Jon present as chaperone. -- Post-surgical incisions all fully healed well without any peri-incisional thickening, nodularity or dermal change appreciated.    There is the radiation change to the left breast, leaving the right breast significantly more supple.  However there is no suspicious dermal nodularity, skin changes, breast nodularity or dominant masses in either breast.      Mammogram 10/30 FINDINGS: There are no findings suspicious for malignancy.   IMPRESSION: No mammographic evidence of malignancy. A result letter of this screening mammogram will be mailed directly to the patient.   RECOMMENDATION: Screening mammogram in one year. (Code:[id number]-01Y)   BI-RADS CATEGORY  1: Negative.  Assessment/Plan:  Left breast cancer s/p lumpectomy with SLNB and radiation     - return to clinic annually or as needed, instructed to call office if any questions or concerns   All of the above recommendations were discussed with the patient, and all of patient's questions were answered to her expressed satisfaction.   Jayson Endow, M.D. [facility]

## 2024-10-24 ENCOUNTER — Other Ambulatory Visit: Payer: Self-pay | Admitting: Cardiology

## 2024-12-04 ENCOUNTER — Encounter: Payer: Self-pay | Admitting: Dietician

## 2024-12-04 ENCOUNTER — Encounter: Attending: Cardiology | Admitting: Dietician

## 2024-12-04 VITALS — Ht 63.0 in | Wt 254.1 lb

## 2024-12-04 DIAGNOSIS — Z6841 Body Mass Index (BMI) 40.0 and over, adult: Secondary | ICD-10-CM | POA: Diagnosis present

## 2024-12-04 DIAGNOSIS — E66813 Obesity, class 3: Secondary | ICD-10-CM | POA: Insufficient documentation

## 2024-12-04 NOTE — Patient Instructions (Addendum)
 Begin to look into certain things that steer your behavior towards eating fast food daily. Write these things down on your worksheets and look for patterns of triggers that lead you to choosing fast food.  Bring your hula hoop back out into the middle of your living room when you get home.

## 2024-12-04 NOTE — Progress Notes (Signed)
 Medical Nutrition Therapy  Appointment Start time:  [phone]  Appointment End time:  0855  Primary concerns today: Weight Loss  Referral diagnosis: E66.813 - Class 3 Obesity, Z71.3 - Weight loss counseling Preferred learning style: No preference indicated Learning readiness: Contemplating   NUTRITION ASSESSMENT   Anthropometrics: Ht: 63 Wt: 254.1 lbs BMI: 45.01 kg/m2 Wt Change: +4 lbs x9 weeks   Clinical Medical Hx: Obesity, HTN, HLD, IDA, Hypothyroid, Breast cancer Medications: Amlodipine , Rosuvastatin , Losartan , Tamoxifen  Labs (09/04/2024): Iron  - 20, Saturation ratios - 6, RBC - 3.62, Hemoglobin - 9.5, HCT - 30.9, Glucose - 107, Alk Phos - 37, Calcium  - 8.7 Notable Signs/Symptoms: N/A   Lifestyle & Dietary Hx Pt reports lower stress while on break from work currently and states brother has been helping with family responsibilities more, which is helping to lower stress as well. 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 Pt states they have not been stress eating as much. Pt states they have been snacking more recently, states there have been more sweets/snacks around during the holidays. Pt reports eating out for most meals, states they don't feel motivated to cook after work. Pt states they would like to eat at home more for health and financial reasons. Pt reports minimal physical activity, goes for occasional walks with students, no structured activity at home.    Estimated daily fluid intake: 48 oz Supplements: Iron , B12, Vit D, Calcium  Sleep: Moderate to low quality Stress / self-care: High Current average weekly physical activity: ADLs   24-Hr Dietary Recall First Meal: [employer] chicken biscuit, hashbrown, Sprite Snack:  Second Meal: [employer] kickin chicken sandwich, fries, water Snack: 6 Oreos Third Meal:  Snack:  Beverages: Water, Sprite   NUTRITION DIAGNOSIS  NB-1.1 Food and nutrition-related knowledge deficit As  related to Obesity.  As evidenced by BMI of 44.92 kg/m2, IF, high stress/stress eating, low physical activity.   NUTRITION INTERVENTION  Nutrition education (E-1) on the following topics:  Educated patient on the two components of energy balance: Energy in (calories), and energy out (activity). Explain the role of negative energy balance in weight loss. Discussed options with patient to achieve a negative energy balance and how to best control energy in and energy out to accommodate their lifestyle.  Handouts Provided Include  NEW: Behavior Chain Worksheet  Learning Style & Readiness for Change Teaching method utilized: Visual & Auditory  Demonstrated degree of understanding via: Teach Back  Barriers to learning/adherence to lifestyle change: Stress/Busy schedule  Goals Established by Pt Begin to look into certain things that steer your behavior towards eating fast food daily. Write these things down on your worksheets and look for patterns of triggers that lead you to choosing fast food. Bring your hula hoop back out into the middle of your living room when you get home.  MONITORING & EVALUATION Dietary intake, weekly physical activity, stress, and weight change in 3 months  Next Steps  Patient is to follow up PRN.

## 2024-12-07 ENCOUNTER — Other Ambulatory Visit: Payer: Self-pay | Admitting: Oncology

## 2024-12-11 ENCOUNTER — Encounter: Payer: Self-pay | Admitting: Oncology

## 2024-12-25 ENCOUNTER — Other Ambulatory Visit: Payer: Self-pay | Admitting: Cardiology

## 2025-01-25 ENCOUNTER — Ambulatory Visit: Admitting: Cardiology

## 2025-02-13 ENCOUNTER — Encounter: Admitting: Dietician

## 2025-03-06 ENCOUNTER — Other Ambulatory Visit

## 2025-03-13 ENCOUNTER — Ambulatory Visit: Admitting: Oncology
